# Patient Record
Sex: Male | Born: 1945 | Race: White | Hispanic: No | Marital: Married | Smoking: Former smoker
Health system: Southern US, Community
[De-identification: ages and names within clinical notes are randomized; demographics above are authoritative.]

## PROBLEM LIST (undated history)

## (undated) DIAGNOSIS — I10 Essential (primary) hypertension: Secondary | ICD-10-CM

## (undated) DIAGNOSIS — I2510 Atherosclerotic heart disease of native coronary artery without angina pectoris: Secondary | ICD-10-CM

## (undated) DIAGNOSIS — H269 Unspecified cataract: Secondary | ICD-10-CM

## (undated) DIAGNOSIS — I214 Non-ST elevation (NSTEMI) myocardial infarction: Secondary | ICD-10-CM

## (undated) HISTORY — DX: Unspecified cataract: H26.9

## (undated) HISTORY — PX: JOINT REPLACEMENT: SHX530

## (undated) HISTORY — PX: ROTATOR CUFF REPAIR: SHX139

## (undated) HISTORY — PX: HERNIA REPAIR: SHX51

## (undated) HISTORY — PX: CORONARY STENT INTERVENTION: CATH118234

## (undated) HISTORY — PX: KNEE CARTILAGE SURGERY: SHX688

## (undated) HISTORY — PX: FOOT SURGERY: SHX648

## (undated) HISTORY — PX: APPENDECTOMY: SHX54

---

## 2017-07-21 ENCOUNTER — Emergency Department (HOSPITAL_COMMUNITY): Payer: Medicare Other

## 2017-07-21 ENCOUNTER — Encounter (HOSPITAL_COMMUNITY): Payer: Self-pay

## 2017-07-21 ENCOUNTER — Observation Stay (HOSPITAL_COMMUNITY)
Admission: EM | Admit: 2017-07-21 | Discharge: 2017-07-22 | Disposition: A | Discharge status: Home / Self Care | Payer: Medicare Other | Attending: Family Medicine | Admitting: Family Medicine

## 2017-07-21 DIAGNOSIS — L819 Disorder of pigmentation, unspecified: Secondary | ICD-10-CM | POA: Diagnosis not present

## 2017-07-21 DIAGNOSIS — K219 Gastro-esophageal reflux disease without esophagitis: Secondary | ICD-10-CM | POA: Diagnosis not present

## 2017-07-21 DIAGNOSIS — I252 Old myocardial infarction: Secondary | ICD-10-CM | POA: Diagnosis not present

## 2017-07-21 DIAGNOSIS — Z79899 Other long term (current) drug therapy: Secondary | ICD-10-CM | POA: Insufficient documentation

## 2017-07-21 DIAGNOSIS — N179 Acute kidney failure, unspecified: Secondary | ICD-10-CM | POA: Insufficient documentation

## 2017-07-21 DIAGNOSIS — K30 Functional dyspepsia: Secondary | ICD-10-CM | POA: Diagnosis not present

## 2017-07-21 DIAGNOSIS — I2510 Atherosclerotic heart disease of native coronary artery without angina pectoris: Secondary | ICD-10-CM | POA: Insufficient documentation

## 2017-07-21 DIAGNOSIS — F419 Anxiety disorder, unspecified: Secondary | ICD-10-CM | POA: Diagnosis not present

## 2017-07-21 DIAGNOSIS — Z955 Presence of coronary angioplasty implant and graft: Secondary | ICD-10-CM | POA: Diagnosis not present

## 2017-07-21 DIAGNOSIS — Z7982 Long term (current) use of aspirin: Secondary | ICD-10-CM | POA: Diagnosis not present

## 2017-07-21 DIAGNOSIS — I10 Essential (primary) hypertension: Secondary | ICD-10-CM | POA: Insufficient documentation

## 2017-07-21 DIAGNOSIS — R079 Chest pain, unspecified: Secondary | ICD-10-CM | POA: Diagnosis not present

## 2017-07-21 DIAGNOSIS — R110 Nausea: Secondary | ICD-10-CM | POA: Diagnosis not present

## 2017-07-21 HISTORY — DX: Non-ST elevation (NSTEMI) myocardial infarction: I21.4

## 2017-07-21 HISTORY — DX: Essential (primary) hypertension: I10

## 2017-07-21 HISTORY — DX: Atherosclerotic heart disease of native coronary artery without angina pectoris: I25.10

## 2017-07-21 LAB — BASIC METABOLIC PANEL WITH GFR
Anion gap: 10 (ref 5–15)
BUN: 17 mg/dL (ref 6–20)
CO2: 25 mmol/L (ref 22–32)
Calcium: 9.3 mg/dL (ref 8.9–10.3)
Chloride: 100 mmol/L — ABNORMAL LOW (ref 101–111)
Creatinine, Ser: 1.14 mg/dL (ref 0.61–1.24)
GFR calc Af Amer: >60 mL/min
GFR calc non Af Amer: >60 mL/min
Glucose, Bld: 104 mg/dL — ABNORMAL HIGH (ref 65–99)
Potassium: 4.4 mmol/L (ref 3.5–5.1)
Sodium: 135 mmol/L (ref 135–145)

## 2017-07-21 LAB — CBC
HCT: 45.4 % (ref 39.0–52.0)
Hemoglobin: 15.6 g/dL (ref 13.0–17.0)
MCH: 29.8 pg (ref 26.0–34.0)
MCHC: 34.4 g/dL (ref 30.0–36.0)
MCV: 86.8 fL (ref 78.0–100.0)
Platelets: 226 10*3/uL (ref 150–400)
RBC: 5.23 MIL/uL (ref 4.22–5.81)
RDW: 13.3 % (ref 11.5–15.5)
WBC: 7.5 10*3/uL (ref 4.0–10.5)

## 2017-07-21 LAB — I-STAT TROPONIN, ED
TROPONIN I, POC: 0 ng/mL (ref 0.00–0.08)
Troponin i, poc: 0 ng/mL (ref 0.00–0.08)

## 2017-07-21 LAB — PROTIME-INR
INR: 1.03
Prothrombin Time: 13.4 seconds (ref 11.4–15.2)

## 2017-07-21 LAB — TROPONIN I

## 2017-07-21 MED ORDER — CARVEDILOL 3.125 MG PO TABS
3.1250 mg | ORAL_TABLET | Freq: Two times a day (BID) | ORAL | Status: DC
  Administered 2017-07-22: 3.125 mg via ORAL
  Filled 2017-07-21 (×3): qty 1

## 2017-07-21 MED ORDER — HEPARIN SODIUM (PORCINE) 5000 UNIT/ML IJ SOLN
5000.0000 [IU] | Freq: Three times a day (TID) | INTRAMUSCULAR | Status: DC
  Administered 2017-07-21 – 2017-07-22 (×2): 5000 [IU] via SUBCUTANEOUS
  Filled 2017-07-21 (×2): qty 1

## 2017-07-21 MED ORDER — NITROGLYCERIN 0.4 MG SL SUBL: 0.4000 mg | SUBLINGUAL_TABLET | SUBLINGUAL | Status: DC | PRN

## 2017-07-21 MED ORDER — ASPIRIN 81 MG PO CHEW
81.0000 mg | CHEWABLE_TABLET | Freq: Every day | ORAL | Status: DC
  Administered 2017-07-22: 81 mg via ORAL
  Filled 2017-07-21: qty 1

## 2017-07-21 MED ORDER — GI COCKTAIL ~~LOC~~: 30.0000 mL | Freq: Once | ORAL | Status: DC | PRN

## 2017-07-21 MED ORDER — PRASUGREL HCL 10 MG PO TABS
10.0000 mg | ORAL_TABLET | Freq: Every day | ORAL | Status: DC
  Administered 2017-07-22: 10 mg via ORAL
  Filled 2017-07-21: qty 1

## 2017-07-21 MED ORDER — LISINOPRIL 10 MG PO TABS
10.0000 mg | ORAL_TABLET | Freq: Every day | ORAL | Status: DC
  Administered 2017-07-22: 10 mg via ORAL
  Filled 2017-07-21: qty 1

## 2017-07-21 MED ORDER — ATORVASTATIN CALCIUM 80 MG PO TABS
80.0000 mg | ORAL_TABLET | Freq: Every day | ORAL | Status: DC
  Administered 2017-07-21: 80 mg via ORAL
  Filled 2017-07-21: qty 1

## 2017-07-21 MED ORDER — ASPIRIN 81 MG PO CHEW
324.0000 mg | CHEWABLE_TABLET | Freq: Once | ORAL | Status: DC
  Filled 2017-07-21: qty 4

## 2017-07-21 MED ORDER — ONDANSETRON HCL 4 MG/2ML IJ SOLN: 4.0000 mg | Freq: Four times a day (QID) | INTRAMUSCULAR | Status: DC | PRN

## 2017-07-21 MED ORDER — ACETAMINOPHEN 325 MG PO TABS: 650.0000 mg | ORAL_TABLET | ORAL | Status: DC | PRN

## 2017-07-21 MED ORDER — LISINOPRIL-HYDROCHLOROTHIAZIDE 10-12.5 MG PO TABS: 1.0000 | ORAL_TABLET | Freq: Every day | ORAL | Status: DC

## 2017-07-21 MED ORDER — HYDROCHLOROTHIAZIDE 12.5 MG PO CAPS
12.5000 mg | ORAL_CAPSULE | Freq: Every day | ORAL | Status: DC
  Administered 2017-07-22: 12.5 mg via ORAL
  Filled 2017-07-21: qty 1

## 2017-07-21 NOTE — ED Notes (Signed)
Carvedilol held as BP soft

## 2017-07-21 NOTE — ED Notes (Signed)
Spoke with Alinda Sierras on 3E; requested ETA of when nurse to transport patient to floor - states she will remind Santiago Glad, RN again that patient needs to be transported.

## 2017-07-21 NOTE — Plan of Care (Signed)
  Problem: Activity: Goal: Risk for activity intolerance will decrease Outcome: Progressing   Problem: Safety: Goal: Ability to remain free from injury will improve Outcome: Progressing   

## 2017-07-21 NOTE — ED Notes (Signed)
Call 3E

## 2017-07-21 NOTE — ED Notes (Signed)
Ordered pt a dinner tray 

## 2017-07-21 NOTE — ED Triage Notes (Signed)
Patient developed recurrent cp lat night after having cardiac stent placed 2 weeks ago at [facility]. On arrival alert and oriented, reports that pain is resolved

## 2017-07-21 NOTE — H&P (Addendum)
[facility] Admission History and Physical Service Pager: [phone]  Patient name: Andres [name] Medical record number: [id number] Date of birth: 1945/08/18 Age: 72 y.o. Gender: male  Primary Care Provider: [name], MD Consultants: curbside consult to outpatient cardiologist  Code Status: Full   Chief Complaint: nausea/indigestion   Assessment and Plan: Andres [name] is a 72 y.o. male presenting with nausea/indigestion x1day . PMH is significant for CAD with recent NSTEMI, erectile dysfunction, anxiety, HTN  Nausea/Indigestion Likely cardiac in origin. Patient believes nausea feels very similar to symptoms prior to last MI. Patient states nausea lasting for entire day so less likely food related. Afebrile and no elevation in WBC count so less likely infectious. Per discussion with ED, ED physcian spoke to [name] (patient's cardiologist) to discuss case. [name] recommended trending serial troponins and am EKG. Per [name], patient's EKG is similar to prior EKG's. Per care everywhere records, EKG on admission for NSTEMI showing nonspecific ST depressions and T wave inversion in precordial leads. iStat troponins neg x2. Cannot rule out GERD. Also notes a hx of GERD intermittently in the past, however states more recent episode of nausea immediately prior to stenting  was proceeded by patient feeling "like he was going to die" while walking to stadium earlier that night. Denies anything like that having happened with this episode of nausea. -admit to telemetry, attending Dr. [name]  -GI cocktail prn  -am EKG -trend troponins  -continuous cardiac monitoring  -am CBC, BMP -ASA 324mg  once  -zofran prn  -vital signs per routine  CAD with recent NSTEMI Cardiac cath from 07/07/17 showing inferior wall hypokinesis with mild reduction in LV systolic function, coronary arteries are calcified, coronary artery disease including 95% calcified mid-RCA  stenosis, 60% mid-LAD stenosis, 50% OM1 stenosis and 50% proximal circumflex stenosis. PCI of mid-RCA with placement of an Onyx drug eluting stent was performed. Patient had follow up appointment with [name], cardiology, on 07/21/17 but missed appointment because he came to ED. Echo 07/02/15 (EF 60->70% w/ exercise). Home meds: ASA 81mg , lipitor 80mg  daily, coreg 3.125mg  bid, prinzide 10-12.5 mg daily, effient 10mg  daily  -continue home meds -low threshold for cardiac consult  -trend troponins  -am EKG  HTN: BP 109/77, per patient usually 120s/80s. Patient denies any back pain, or pain radiating to his back. Blood pressure similar on both arms. States he has not had much to drink today.  - Will continue follow blood pressure  - Continue prinizide for now, hold tomorrow if blood pressure is low   AKI Current Cr 1.14. Cr during admission at [facility] 0.96. Unclear etiology, patient did note he hadn't eaten all day so possibly dehydration  -encourage PO intake  -monitor on daily BMP  Anxiety Currently not taking any anti-anxiety medications. Recent PHQ-9 of 0 on 07/14/17.  -continue to monitor  FEN/GI: heart healthy  Prophylaxis: heparin   Disposition: Admit to telemetry, attending Dr. [name]   History of Present Illness:  Andres [name] is a 72 y.o. male presenting with nausea/indigetion x 1day. States nausea began yesterday and lasted all day but worsened after dinner at 8pm. Patient was then called by his daughter in law who was sick and he drove to [place] to help care for [name]. Patient states nausea feels very similar to symptoms leading to MI earlier this month. States he also had some right arm pain, but states he went fishing 2 days ago and thinks it is from fishing. Patient also endorses  headache that is in temporal/occipital region. No sick contacts.   Patient has taken all his am medications but not PM. Patient does not know which medications he takes but pill box is made  weekly for him.   Patient had scheduled appointment with cardiologist, [name], this afternoon but missed it because he was in hospital.   Review Of Systems: Per HPI with the following additions:   Review of Systems  Constitutional: Negative for chills and fever.  Respiratory: Positive for cough. Negative for shortness of breath and wheezing.   Cardiovascular: Negative for chest pain and palpitations.  Gastrointestinal: Positive for heartburn and nausea. Negative for abdominal pain, diarrhea and vomiting.  Genitourinary: Negative for dysuria, frequency, hematuria and urgency.  Musculoskeletal: Negative for back pain.       R sided back pain  Neurological: Positive for headaches. Negative for dizziness.    Patient Active Problem List   Diagnosis Date Noted  . Chest pain 07/21/2017    Past Medical History: Past Medical History:  Diagnosis Date  . Coronary artery disease   . Hypertension     Past Surgical History: Past Surgical History:  Procedure Laterality Date  . CORONARY STENT INTERVENTION      Social History: Social History   Tobacco Use  . Smoking status: Not on file  Substance Use Topics  . Alcohol use: Not on file  . Drug use: Not on file   Additional social history: denies current tobacco use (quit in 1983), denies current alcohol use (quit in 1025), denies illicit drug use. Lives with wife in apartment in [place].   Please also refer to relevant sections of EMR.  Family History: No family history on file. Father - heart disease   Allergies and Medications: No Known Allergies No current facility-administered medications on file prior to encounter.    Current Outpatient Medications on File Prior to Encounter  Medication Sig Dispense Refill  . aspirin 81 MG chewable tablet Chew 81 mg by mouth daily.    Marland Kitchen atorvastatin (LIPITOR) 80 MG tablet Take 80 mg by mouth daily.    . carvedilol (COREG) 3.125 MG tablet Take 3.125 mg by mouth 2 (two) times daily  with a meal.    . lisinopril-hydrochlorothiazide (PRINZIDE,ZESTORETIC) 10-12.5 MG tablet Take 1 tablet by mouth daily.    . nitroGLYCERIN (NITROSTAT) 0.4 MG SL tablet Place 0.4 mg under the tongue every 5 (five) minutes as needed for chest pain.    . prasugrel (EFFIENT) 10 MG TABS tablet Take 10 mg by mouth daily.      Objective: BP 114/69   Pulse 68   Temp 97.6 F (36.4 C) (Oral)   Resp 10   SpO2 94%  Exam: General: awake and alert, laying in bed, NAD Eyes: PERRL ENTM: moist mucous membranes  Cardiovascular: RRR, no MRG,  Respiratory: CTAB, no wheezes, rales, or rhonchi  Gastrointestinal: soft, non tender, non distended, bowel sounds normal  MSK: no edema, non tender, 5/5 muscle strength bilaterally, normal grip strength  Derm: skin intact, no rashes  Neuro: no focal deficits, sensation intact bilaterally Psych: normal affect   Labs and Imaging: CBC BMET  Recent Labs  Lab 07/21/17 0958  WBC 7.5  HGB 15.6  HCT 45.4  PLT 226   Recent Labs  Lab 07/21/17 0958  NA 135  K 4.4  CL 100*  CO2 25  BUN 17  CREATININE 1.14  GLUCOSE 104*  CALCIUM 9.3      Ref. Range 07/21/2017 10:25  07/21/2017 13:36  Troponin i, poc Latest Ref Range: 0.00 - 0.08 ng/mL 0.00 0.00    Ref. Range 07/21/2017 09:58  Prothrombin Time Latest Ref Range: 11.4 - 15.2 seconds 13.4  INR Unknown 1.03   EKG: nonspecific T wave changes in inferior leads and leads V5-V6  Dg Chest 2 View  Result Date: 07/21/2017 CLINICAL DATA:  Chest pain EXAM: CHEST - 2 VIEW COMPARISON:  None. FINDINGS: There is no edema or consolidation. The heart size and pulmonary vascularity are normal. No adenopathy. There is thoracolumbar levoscoliosis. There is calcification in the right coronary artery. IMPRESSION: No edema or consolidation. Right coronary artery calcification noted. Electronically Signed   By: Lowella Grip III M.D.   On: 07/21/2017 10:18     Caroline More, DO 07/21/2017, 4:38 PM PGY-1, [place] [facility] pager: [phone], text pages welcome  UPPER LEVEL ADDENDUM  I have read the above note and made revisions highlighted in blue.  Kerrin Mo, MD, PGY-3 [facility]

## 2017-07-21 NOTE — ED Notes (Signed)
Report given to Bynum on 3E.  They will come get pt

## 2017-07-21 NOTE — Progress Notes (Signed)
Pt. Transferred from ED holding in alert and stable condition. No s/s of distress or discomfort noted upon arrival. Pt. Denies pain at this time. Pt. Oriented to room and placed on telemetry. CCMD notified of pts. Arrival to unit. Call light placed within reach. Pt. Alert and independent in the room. Pt. Told to call for assisted when needed. RN will continue to monitor pt. For changes in condition.Andres [name], [name]

## 2017-07-21 NOTE — ED Provider Notes (Signed)
[facility] EMERGENCY DEPARTMENT Provider Note   CSN: [id number] Arrival date & time: 07/21/17  5465     History   Chief Complaint No chief complaint on file.   HPI Andres [name] is a 72 y.o. male.  72 year old male with past medical history including hypertension, NSTEMI, CAD status post stenting 2 weeks ago who p/w nausea and digestion.  Patient states that he presented to his PCP 2 weeks ago for what he thought was indigestion.  They drew labs and noted an elevated troponin and he was sent to [facility] where he had a cardiac catheterization and stent placement.  He was discharged home the next day on medications which he has been taking as prescribed.  He reports that he has been doing well at home until yesterday evening.  He states that he ate a heavy meal and afterwards began feeling nauseated and indigestion feeling. He had some right arm pain but thinks it was related to fishing with a lot of casting from right arm yesterday. He thought the nausea/indigestion was related to what he ate and the fact that he has been not drinking caffeine recently but he later became concerned that it might be related to his heart as he had the same symptoms leading up to his heart attack.  He denies any chest pain, tightness, or discomfort whatsoever and no shortness of breath, vomiting, or diaphoresis.  He denies any exertional shortness of breath or chest pain recently.  He was supposed to have a first-time cardiology appointment this afternoon but came here as he is currently visiting his daughter-in-law who is ill.  He still has the nausea indigestion feeling but again denies any chest pain.  The history is provided by the patient.    Past Medical History:  Diagnosis Date  . Coronary artery disease   . Hypertension     Patient Active Problem List   Diagnosis Date Noted  . Chest pain 07/21/2017    Past Surgical History:  Procedure Laterality Date  . CORONARY STENT  INTERVENTION          Home Medications    Prior to Admission medications   Medication Sig Start Date End Date Taking? Authorizing Provider  aspirin 81 MG chewable tablet Chew 81 mg by mouth daily.   Yes [provider]  atorvastatin (LIPITOR) 80 MG tablet Take 80 mg by mouth daily.   Yes [provider]  carvedilol (COREG) 3.125 MG tablet Take 3.125 mg by mouth 2 (two) times daily with a meal.   Yes [provider]  lisinopril-hydrochlorothiazide (PRINZIDE,ZESTORETIC) 10-12.5 MG tablet Take 1 tablet by mouth daily.   Yes [provider]  nitroGLYCERIN (NITROSTAT) 0.4 MG SL tablet Place 0.4 mg under the tongue every 5 (five) minutes as needed for chest pain.   Yes [provider]  prasugrel (EFFIENT) 10 MG TABS tablet Take 10 mg by mouth daily.   Yes [provider]    Family History No family history on file.  Social History Social History   Tobacco Use  . Smoking status: Not on file  Substance Use Topics  . Alcohol use: Not on file  . Drug use: Not on file     Allergies   Patient has no known allergies.   Review of Systems Review of Systems  All other systems reviewed and are negative except that which was mentioned in HPI  Physical Exam Updated Vital Signs BP 112/86 (BP Location: Right Arm)   Pulse Marland Kitchen)  58   Temp 97.6 F (36.4 C) (Oral)   Resp 14   SpO2 97%   Physical Exam  Constitutional: He is oriented to person, place, and time. He appears well-developed and well-nourished. No distress.  HENT:  Head: Normocephalic and atraumatic.  Moist mucous membranes  Eyes: Conjunctivae are normal.  Neck: Neck supple.  Cardiovascular: Normal rate, regular rhythm and normal heart sounds.  No murmur heard. Pulmonary/Chest: Effort normal and breath sounds normal.  Abdominal: Soft. Bowel sounds are normal. He exhibits no distension. There is no tenderness.  Musculoskeletal: He exhibits no edema.  Neurological: He is  alert and oriented to person, place, and time.  Fluent speech  Skin: Skin is warm and dry.  Psychiatric:  anxious  Nursing note and vitals reviewed.    ED Treatments / Results  Labs (all labs ordered are listed, but only abnormal results are displayed) Labs Reviewed  BASIC METABOLIC PANEL - Abnormal; Notable for the following components:      Result Value   Chloride 100 (*)    Glucose, Bld 104 (*)    All other components within normal limits  CBC  PROTIME-INR  I-STAT TROPONIN, ED  I-STAT TROPONIN, ED    EKG EKG Interpretation  Date/Time:  Wednesday July 21 2017 09:51:08 EDT Ventricular Rate:  66 PR Interval:  138 QRS Duration: 84 QT Interval:  410 QTC Calculation: 429 R Axis:   6 Text Interpretation:  Normal sinus rhythm ST & T wave abnormality, consider inferior ischemia ST & T wave abnormality, consider anterolateral ischemia Abnormal ECG No previous ECGs available Confirmed by Theotis Burrow [phone]) on 07/21/2017 12:07:18 PM   Radiology Dg Chest 2 View  Result Date: 07/21/2017 CLINICAL DATA:  Chest pain EXAM: CHEST - 2 VIEW COMPARISON:  None. FINDINGS: There is no edema or consolidation. The heart size and pulmonary vascularity are normal. No adenopathy. There is thoracolumbar levoscoliosis. There is calcification in the right coronary artery. IMPRESSION: No edema or consolidation. Right coronary artery calcification noted. Electronically Signed   By: Lowella Grip III M.D.   On: 07/21/2017 10:18    Procedures Procedures (including critical care time)  Medications Ordered in ED Medications  aspirin chewable tablet 324 mg (324 mg Oral Not Given 07/21/17 1438)     Initial Impression / Assessment and Plan / ED Course  I have reviewed the triage vital signs and the nursing notes.  Pertinent labs & imaging results that were available during my care of the patient were reviewed by me and considered in my medical decision making (see chart for details).      Pt well appearing, anxious on exam, mildly hypertensive.  EKG without acute ischemic changes, no previous available for comparison.  Initial troponin negative and reassuring basic labs. No sx to suggest PE or aortic dissection.  CXR negative.  I discussed pt's presentation and work up with his cardiologist at [facility], Dr. Royce Macadamia.  She felt that the EKG was relatively similar to previous.  He was noted to have some stenosis of his LAD on the cath from last week.  Because of this and his current symptoms that are similar to symptoms that previously brought him into the hospital, she recommended observation admission for serial troponins and EKGs.  She felt that if workup negative, he can be discharged to follow-up in her clinic.  If the patient's troponin climbs, the [facility] cardiology team can be contacted to discuss possible transfer. Discussed admission with family medicine teaching service and patient  admitted for further care.  Final Clinical Impressions(s) / ED Diagnoses   Final diagnoses:  None    ED Discharge Orders    None       Little, Wenda Overland, MD 07/21/17 [phone]

## 2017-07-21 NOTE — ED Notes (Signed)
Called 3E to remind them that patient needs to be transported to floor; Network engineer states will remind nurse.

## 2017-07-22 ENCOUNTER — Other Ambulatory Visit: Payer: Self-pay

## 2017-07-22 ENCOUNTER — Encounter (HOSPITAL_COMMUNITY): Payer: Self-pay | Admitting: General Practice

## 2017-07-22 DIAGNOSIS — R079 Chest pain, unspecified: Secondary | ICD-10-CM | POA: Diagnosis not present

## 2017-07-22 DIAGNOSIS — K30 Functional dyspepsia: Secondary | ICD-10-CM | POA: Diagnosis not present

## 2017-07-22 DIAGNOSIS — R110 Nausea: Secondary | ICD-10-CM

## 2017-07-22 DIAGNOSIS — I2510 Atherosclerotic heart disease of native coronary artery without angina pectoris: Secondary | ICD-10-CM | POA: Diagnosis not present

## 2017-07-22 LAB — BASIC METABOLIC PANEL
Anion gap: 9 (ref 5–15)
BUN: 20 mg/dL (ref 6–20)
CO2: 26 mmol/L (ref 22–32)
CREATININE: 1.24 mg/dL (ref 0.61–1.24)
Calcium: 8.9 mg/dL (ref 8.9–10.3)
Chloride: 102 mmol/L (ref 101–111)
GFR calc non Af Amer: 58 mL/min — ABNORMAL LOW (ref >60)
GLUCOSE: 93 mg/dL (ref 65–99)
Potassium: 4 mmol/L (ref 3.5–5.1)
Sodium: 137 mmol/L (ref 135–145)

## 2017-07-22 LAB — TROPONIN I: Troponin I: <0.03 ng/mL (ref <0.03)

## 2017-07-22 LAB — CBC
HCT: 42.8 % (ref 39.0–52.0)
Hemoglobin: 14.7 g/dL (ref 13.0–17.0)
MCH: 29.8 pg (ref 26.0–34.0)
MCHC: 34.3 g/dL (ref 30.0–36.0)
MCV: 86.6 fL (ref 78.0–100.0)
PLATELETS: 193 10*3/uL (ref 150–400)
RBC: 4.94 MIL/uL (ref 4.22–5.81)
RDW: 13.3 % (ref 11.5–15.5)
WBC: 6.7 10*3/uL (ref 4.0–10.5)

## 2017-07-22 LAB — GLUCOSE, CAPILLARY: GLUCOSE-CAPILLARY: 92 mg/dL (ref 65–99)

## 2017-07-22 MED ORDER — LISINOPRIL 5 MG PO TABS: 10.0000 mg | ORAL_TABLET | Freq: Every day | ORAL | 0 refills | Status: DC

## 2017-07-22 NOTE — Discharge Instructions (Signed)
You were admitted to the hospital with nausea and had a rule out for acute cardiac syndrome.  Lab work and EKG showed no cardiac etiology.  It is very important that you follow-up with your outpatient cardiologist as soon as possible.  After discharge please stop taking your lisinopril/hydrochlorothiazide.  I have sent lisinopril 5 mg to take daily before going to your cardiologist.  Continue to work on watching the sodium in your diet.

## 2017-07-22 NOTE — Discharge Summary (Addendum)
[facility] Discharge Summary  Patient name: Andres [name] Medical record number: [id number] Date of birth: 1945-09-27 Age: 72 y.o. Gender: male Date of Admission: 07/21/2017  Date of Discharge: 07/22/17  Admitting Physician: Lind Covert, MD  Primary Care Provider: [name], MD Consultants: None  Indication for Hospitalization: Nausea and ACS rule out  Discharge Diagnoses/Problem List:  Nausea/indigestion CAD with recent N STEMI Hypertension AKI Anxiety  Disposition: Home  Discharge Condition: Stable  Discharge Exam:  General: NAD, pleasant, sitting up in bed Eyes: PERRL, EOMI, no conjunctival pallor or injection ENTM: Moist mucous membranes Cardiovascular: RRR, no m/r/g, no LE edema Respiratory: CTA BL, normal work of breathing Derm: no rashes appreciated Neuro: CN II-XII grossly intact Psych: AOx3, appropriate affect  Brief Hospital Course:  This 72 year old male presented to the hospital with nausea and indigestion for 1 day.  Patient with recent heart cath on 07/07/2017 with stent placement.  During previous episode patient reported feeling nauseous and wanted to rule out cardiac event at this time.  In the ED, ED provider reported that she spoke with outpatient cardiologist [name] whom patient was supposed to follow-up with on day of admission.  [name] desired trending of troponins as well as a.m. EKG to ensure there are no cardiac etiology.  Patient also with history of GERD.  Prior to discharge patient with negative troponin x3 and unchanged EKG from prior with TWI in same leads as seen in Care Everywhere on 3/19.    Patient to follow-up with cardiologist Monday, April 1.  Prior to discharge patient denied nausea and reported no chest pain.  Patient reported that he is feeling much better.  Issues for Follow Up:  1. Patient with mild AK I on admission, HCTZ held at discharge to be restarted by cardiologist/PCP. Also  encouraged to have good intake.  2. Patient also with soft BPs.  Lisinopril 10 decrease to lisinopril 5 mg prior to discharge.  May need to increase back to previous dose. 3. Patient has a large 1 cm irregular pigmented lesion on upper mid back that might need a biopsy to rule out skin cancer  Significant Procedures: None  Significant Labs and Imaging:  Recent Labs  Lab 07/21/17 0958 07/22/17 0400  WBC 7.5 6.7  HGB 15.6 14.7  HCT 45.4 42.8  PLT 226 193   Recent Labs  Lab 07/21/17 0958 07/22/17 0400  NA 135 137  K 4.4 4.0  CL 100* 102  CO2 25 26  GLUCOSE 104* 93  BUN 17 20  CREATININE 1.14 1.24  CALCIUM 9.3 8.9   Troponin negative x3  Results/Tests Pending at Time of Discharge: None  Discharge Medications:  Allergies as of 07/22/2017   No Known Allergies     Medication List    STOP taking these medications   lisinopril-hydrochlorothiazide 10-12.5 MG tablet Commonly known as:  PRINZIDE,ZESTORETIC     TAKE these medications   aspirin 81 MG chewable tablet Chew 81 mg by mouth daily.   atorvastatin 80 MG tablet Commonly known as:  LIPITOR Take 80 mg by mouth daily.   carvedilol 3.125 MG tablet Commonly known as:  COREG Take 3.125 mg by mouth 2 (two) times daily with a meal.   lisinopril 5 MG tablet Commonly known as:  PRINIVIL,ZESTRIL Take 2 tablets (10 mg total) by mouth daily.   nitroGLYCERIN 0.4 MG SL tablet Commonly known as:  NITROSTAT Place 0.4 mg under the tongue every 5 (five) minutes as needed  for chest pain.   prasugrel 10 MG Tabs tablet Commonly known as:  EFFIENT Take 10 mg by mouth daily.       Discharge Instructions: Please refer to Patient Instructions section of EMR for full details.  Patient was counseled important signs and symptoms that should prompt return to medical care, changes in medications, dietary instructions, activity restrictions, and follow up appointments.   Follow-Up Appointments:   [name], [name], DO 07/22/2017,  8:20 AM PGY-1, [place]

## 2017-07-22 NOTE — Progress Notes (Signed)
Pt discharged home, IV and tele removed. Discharged instructions given.

## 2020-09-23 ENCOUNTER — Emergency Department (HOSPITAL_COMMUNITY)
Admission: EM | Admit: 2020-09-23 | Discharge: 2020-09-23 | Disposition: A | Discharge status: Home / Self Care | Payer: Medicare Other | Attending: Emergency Medicine | Admitting: Emergency Medicine

## 2020-09-23 ENCOUNTER — Other Ambulatory Visit: Payer: Self-pay

## 2020-09-23 ENCOUNTER — Emergency Department (HOSPITAL_COMMUNITY): Payer: Medicare Other

## 2020-09-23 ENCOUNTER — Encounter (HOSPITAL_COMMUNITY): Payer: Self-pay | Admitting: *Deleted

## 2020-09-23 DIAGNOSIS — I2510 Atherosclerotic heart disease of native coronary artery without angina pectoris: Secondary | ICD-10-CM | POA: Insufficient documentation

## 2020-09-23 DIAGNOSIS — Z87891 Personal history of nicotine dependence: Secondary | ICD-10-CM | POA: Insufficient documentation

## 2020-09-23 DIAGNOSIS — W500XXA Accidental hit or strike by another person, initial encounter: Secondary | ICD-10-CM | POA: Diagnosis not present

## 2020-09-23 DIAGNOSIS — I10 Essential (primary) hypertension: Secondary | ICD-10-CM | POA: Diagnosis not present

## 2020-09-23 DIAGNOSIS — Z7982 Long term (current) use of aspirin: Secondary | ICD-10-CM | POA: Diagnosis not present

## 2020-09-23 DIAGNOSIS — Y9367 Activity, basketball: Secondary | ICD-10-CM | POA: Insufficient documentation

## 2020-09-23 DIAGNOSIS — S20211A Contusion of right front wall of thorax, initial encounter: Secondary | ICD-10-CM | POA: Diagnosis not present

## 2020-09-23 DIAGNOSIS — Z955 Presence of coronary angioplasty implant and graft: Secondary | ICD-10-CM | POA: Diagnosis not present

## 2020-09-23 DIAGNOSIS — Z79899 Other long term (current) drug therapy: Secondary | ICD-10-CM | POA: Insufficient documentation

## 2020-09-23 DIAGNOSIS — S299XXA Unspecified injury of thorax, initial encounter: Secondary | ICD-10-CM | POA: Diagnosis present

## 2020-09-23 NOTE — ED Provider Notes (Signed)
[facility] DEPT Provider Note   CSN: [id number] Arrival date & time: 09/23/20  8315     History Chief Complaint  Patient presents with  . Rib Injury    Andres [name] is a 75 y.o. male.  HPI Patient presents with right-sided chest pain after injury a week ago.  States he was playing basketball and collided sideways with another player.  Pain in the right chest wall immediately.  Has had some continued pain but not too severe.  States began to hurt more today.  Not really short of breath.  Worse with certain movements.  No abdominal pain.  No lightheadedness or dizziness.  No bruising.    Past Medical History:  Diagnosis Date  . Coronary artery disease   . Hypertension   . NSTEMI (non-ST elevated myocardial infarction) ([place]) 3/209    Patient Active Problem List   Diagnosis Date Noted  . Nausea   . Chest pain 07/21/2017    Past Surgical History:  Procedure Laterality Date  . APPENDECTOMY    . CORONARY STENT INTERVENTION    . FOOT SURGERY    . HERNIA REPAIR    . KNEE CARTILAGE SURGERY Right   . ROTATOR CUFF REPAIR         No family history on file.  Social History   Tobacco Use  . Smoking status: Former Research scientist (life sciences)  . Smokeless tobacco: Never Used  Vaping Use  . Vaping Use: Never used  Substance Use Topics  . Alcohol use: Not Currently  . Drug use: Never    Home Medications Prior to Admission medications   Medication Sig Start Date End Date Taking? Authorizing Provider  aspirin 81 MG chewable tablet Chew 81 mg by mouth daily.    [provider]  atorvastatin (LIPITOR) 80 MG tablet Take 80 mg by mouth daily.    [provider]  carvedilol (COREG) 3.125 MG tablet Take 3.125 mg by mouth 2 (two) times daily with a meal.    [provider]  lisinopril (PRINIVIL,ZESTRIL) 5 MG tablet Take 2 tablets (10 mg total) by mouth daily. 07/22/17   [name], [name], DO  nitroGLYCERIN (NITROSTAT) 0.4 MG SL tablet Place  0.4 mg under the tongue every 5 (five) minutes as needed for chest pain.    [provider]  prasugrel (EFFIENT) 10 MG TABS tablet Take 10 mg by mouth daily.    [provider]    Allergies    Patient has no known allergies.  Review of Systems   Review of Systems  Constitutional: Negative for activity change and fever.  HENT: Negative for congestion.   Respiratory: Negative for shortness of breath.   Cardiovascular: Positive for chest pain. Negative for leg swelling.  Gastrointestinal: Negative for abdominal pain.  Genitourinary: Negative for flank pain.  Musculoskeletal: Negative for back pain.  Skin: Negative for rash.  Neurological: Negative for weakness.  Psychiatric/Behavioral: Negative for confusion.    Physical Exam Updated Vital Signs BP (!) 147/94   Pulse 63   Temp 98.7 F (37.1 C) (Oral)   Resp 18   Ht 5\' 9"  (1.753 m)   Wt 83.9 kg   BMI 27.32 kg/m   Physical Exam Vitals and nursing note reviewed.  HENT:     Head: Atraumatic.     Right Ear: External ear normal.     Left Ear: External ear normal.     Nose: Nose normal.  Cardiovascular:     Rate and Rhythm: Regular rhythm.  Pulmonary:     Breath sounds: No rhonchi.     Comments: Tenderness to right lateral to anterior lower chest wall.  Is point tender without crepitance or deformity. Chest:     Chest wall: Tenderness present.  Abdominal:     Tenderness: There is no abdominal tenderness.     Comments: No abdominal tenderness.  Specifically no right upper quadrant or subcostal tenderness.  No bruising.  No rash.  Musculoskeletal:        General: Normal range of motion.     Cervical back: Neck supple.  Skin:    General: Skin is warm.     Capillary Refill: Capillary refill takes less than 2 seconds.  Neurological:     Mental Status: He is alert and oriented to person, place, and time.     ED Results / Procedures / Treatments   Labs (all labs ordered are listed, but only abnormal  results are displayed) Labs Reviewed - No data to display  EKG None  Radiology DG Ribs Unilateral W/Chest Right  Result Date: 09/23/2020 CLINICAL DATA:  Pain EXAM: RIGHT RIBS AND CHEST - 3+ VIEW COMPARISON:  March 27th 2019 FINDINGS: Unchanged cardiomediastinal silhouette. Atherosclerotic calcifications. Tortuous thoracic aorta. No pleural effusion or pneumothorax. There are well-healed contour deformities of the RIGHT upper second and third ribs laterally consistent with sequela of remote prior fracture. There is subtle concavity of the RIGHT lateral ninth rib consistent with age-indeterminate fracture. BB overlies the RIGHT lateral tenth rib. Age-indeterminate wedging of L1 and L2. Dextrocurvature of the thoracic spine with levocurvature of the lumbar spine. IMPRESSION: 1. Subtle concavity of the RIGHT lateral ninth rib is consistent with age-indeterminate fracture. Recommend correlation with point tenderness. 2. No displaced fracture subjacent to the BB which marks the site of palpable/painful concern. 3. No pneumothorax. 4. Age-indeterminate wedging of L1 and 2, favored chronic. Recommend correlation with point tenderness. Electronically Signed   By: Valentino Saxon MD   On: 09/23/2020 11:36    Procedures Procedures   Medications Ordered in ED Medications - No data to display  ED Course  I have reviewed the triage vital signs and the nursing notes.  Pertinent labs & imaging results that were available during my care of the patient were reviewed by me and considered in my medical decision making (see chart for details).    MDM Rules/Calculators/A&P                          Patient with right-sided chest pain.  Traumatic.  X-ray shows only old fracture of ribs.  Nothing new seen.  No pneumothorax.  No abdominal tenderness.  Doubt liver injury.  Chest wall contusion versus occult fracture.  Does not think he needs pain meds.  Discharge home with outpatient follow-up as needed Final  Clinical Impression(s) / ED Diagnoses Final diagnoses:  Contusion of right chest wall, initial encounter    Rx / DC Orders ED Discharge Orders    None       Davonna Belling, MD 09/23/20 [phone]

## 2020-09-23 NOTE — ED Triage Notes (Signed)
Pt states he was playing basketball a week ago, body [place] with pain in rt lower rib. Pain continues.

## 2020-11-04 ENCOUNTER — Other Ambulatory Visit: Payer: Self-pay | Admitting: Family Medicine

## 2020-11-04 ENCOUNTER — Ambulatory Visit
Admission: RE | Admit: 2020-11-04 | Discharge: 2020-11-04 | Disposition: A | Discharge status: Home / Self Care | Payer: Medicare Other | Source: Ambulatory Visit | Attending: Family Medicine | Admitting: Family Medicine

## 2020-11-04 DIAGNOSIS — R911 Solitary pulmonary nodule: Secondary | ICD-10-CM

## 2021-04-17 ENCOUNTER — Other Ambulatory Visit: Payer: Self-pay

## 2021-04-17 ENCOUNTER — Encounter (HOSPITAL_BASED_OUTPATIENT_CLINIC_OR_DEPARTMENT_OTHER): Payer: Self-pay

## 2021-04-17 ENCOUNTER — Emergency Department (HOSPITAL_BASED_OUTPATIENT_CLINIC_OR_DEPARTMENT_OTHER)
Admission: EM | Admit: 2021-04-17 | Discharge: 2021-04-17 | Disposition: A | Discharge status: Home / Self Care | Payer: Medicare Other | Attending: Emergency Medicine | Admitting: Emergency Medicine

## 2021-04-17 DIAGNOSIS — Z87891 Personal history of nicotine dependence: Secondary | ICD-10-CM | POA: Insufficient documentation

## 2021-04-17 DIAGNOSIS — T7840XA Allergy, unspecified, initial encounter: Secondary | ICD-10-CM

## 2021-04-17 DIAGNOSIS — I2510 Atherosclerotic heart disease of native coronary artery without angina pectoris: Secondary | ICD-10-CM | POA: Insufficient documentation

## 2021-04-17 DIAGNOSIS — Z79899 Other long term (current) drug therapy: Secondary | ICD-10-CM | POA: Insufficient documentation

## 2021-04-17 DIAGNOSIS — Z20822 Contact with and (suspected) exposure to covid-19: Secondary | ICD-10-CM | POA: Insufficient documentation

## 2021-04-17 DIAGNOSIS — I119 Hypertensive heart disease without heart failure: Secondary | ICD-10-CM | POA: Diagnosis not present

## 2021-04-17 DIAGNOSIS — R220 Localized swelling, mass and lump, head: Secondary | ICD-10-CM | POA: Diagnosis present

## 2021-04-17 DIAGNOSIS — Z7982 Long term (current) use of aspirin: Secondary | ICD-10-CM | POA: Diagnosis not present

## 2021-04-17 LAB — CBC WITH DIFFERENTIAL/PLATELET
Abs Immature Granulocytes: 0.03 10*3/uL (ref 0.00–0.07)
Basophils Absolute: 0 10*3/uL (ref 0.0–0.1)
Basophils Relative: 0 %
Eosinophils Absolute: 0.2 10*3/uL (ref 0.0–0.5)
Eosinophils Relative: 2 %
HCT: 47.2 % (ref 39.0–52.0)
Hemoglobin: 15.6 g/dL (ref 13.0–17.0)
Immature Granulocytes: 0 %
Lymphocytes Relative: 14 %
Lymphs Abs: 1.5 10*3/uL (ref 0.7–4.0)
MCH: 29.6 pg (ref 26.0–34.0)
MCHC: 33.1 g/dL (ref 30.0–36.0)
MCV: 89.6 fL (ref 80.0–100.0)
Monocytes Absolute: 0.7 10*3/uL (ref 0.1–1.0)
Monocytes Relative: 6 %
Neutro Abs: 8.3 10*3/uL — ABNORMAL HIGH (ref 1.7–7.7)
Neutrophils Relative %: 78 %
Platelets: 268 10*3/uL (ref 150–400)
RBC: 5.27 MIL/uL (ref 4.22–5.81)
RDW: 14 % (ref 11.5–15.5)
WBC: 10.7 10*3/uL — ABNORMAL HIGH (ref 4.0–10.5)
nRBC: 0 % (ref 0.0–0.2)

## 2021-04-17 LAB — BASIC METABOLIC PANEL
Anion gap: 9 (ref 5–15)
BUN: 19 mg/dL (ref 8–23)
CO2: 29 mmol/L (ref 22–32)
Calcium: 9.3 mg/dL (ref 8.9–10.3)
Chloride: 102 mmol/L (ref 98–111)
Creatinine, Ser: 1.06 mg/dL (ref 0.61–1.24)
GFR, Estimated: >60 mL/min (ref >60)
Glucose, Bld: 109 mg/dL — ABNORMAL HIGH (ref 70–99)
Potassium: 3.9 mmol/L (ref 3.5–5.1)
Sodium: 140 mmol/L (ref 135–145)

## 2021-04-17 LAB — RESP PANEL BY RT-PCR (FLU A&B, COVID) ARPGX2
Influenza A by PCR: NEGATIVE
Influenza B by PCR: NEGATIVE
SARS Coronavirus 2 by RT PCR: NEGATIVE

## 2021-04-17 MED ORDER — EPINEPHRINE 0.3 MG/0.3ML IJ SOAJ: 0.3000 mg | INTRAMUSCULAR | 1 refills | Status: AC | PRN

## 2021-04-17 MED ORDER — DIPHENHYDRAMINE HCL 25 MG PO CAPS
50.0000 mg | ORAL_CAPSULE | Freq: Once | ORAL | Status: AC
  Administered 2021-04-17: 09:00:00 50 mg via ORAL
  Filled 2021-04-17: qty 2

## 2021-04-17 NOTE — Discharge Instructions (Addendum)
If you develop new or worsening throat symptoms including trouble breathing or swallowing, new or worsening facial/lip/tongue swelling, or any other new/concerning symptoms then return to the ER for evaluation.  If you have to use your EpiPen, call 911.  Take benadryl every 4-6 hours for rash/itching/swelling

## 2021-04-17 NOTE — ED Triage Notes (Signed)
Pt arrives POV with complaints of allergic reaction. Pt presents with hives on bil arms, abdomen, swelling on mouth and lips, rash on his groin area that started yesterday. Pt says he ate chocolate cake and ice cream yesterday. Benadryl and tylenol last taken last night. Pt does take lisinopril and last took it last night. Pt denies trouble breathing but says he has difficulty speaking. AOx4, VSS

## 2021-04-17 NOTE — ED Provider Notes (Signed)
[facility] EMERGENCY DEPT Provider Note   CSN: [id number] Arrival date & time: 04/17/21  6160     History Chief Complaint  Patient presents with   Allergic Reaction    Andres [name] is a 75 y.o. male.  HPI 75 year old male presents with lip swelling and change in voice.  This started yesterday.  At first it was just his top lips but now it is both lips.  He is also had swelling and itchiness to his arms and groin.  He is eating many different things that he wonders if they could be causing it though he has had these foods many times before.  Took some Benadryl last night.  After waking up he drank some coffee and then took a nap and when he woke up he was having a change in his voice feels like it is in his larynx.  He is not having trouble breathing and is able to swallow. 40+ years ago he used to have swelling of his lips that he states hasn't recurred since he stopped using drugs.   Past Medical History:  Diagnosis Date   Coronary artery disease    Hypertension    NSTEMI (non-ST elevated myocardial infarction) ([place]) 3/209    Patient Active Problem List   Diagnosis Date Noted   Nausea    Chest pain 07/21/2017    Past Surgical History:  Procedure Laterality Date   APPENDECTOMY     CORONARY STENT INTERVENTION     FOOT SURGERY     HERNIA REPAIR     KNEE CARTILAGE SURGERY Right    ROTATOR CUFF REPAIR         No family history on file.  Social History   Tobacco Use   Smoking status: Former   Smokeless tobacco: Never  Scientific laboratory technician Use: Never used  Substance Use Topics   Alcohol use: Not Currently   Drug use: Never    Home Medications Prior to Admission medications   Medication Sig Start Date End Date Taking? Authorizing Provider  EPINEPHrine 0.3 mg/0.3 mL IJ SOAJ injection Inject 0.3 mg into the muscle as needed for anaphylaxis. 04/17/21  Yes Sherwood Gambler, MD  aspirin 81 MG chewable tablet Chew 81 mg by mouth daily.    [provider]  atorvastatin (LIPITOR) 80 MG tablet Take 80 mg by mouth daily.    [provider]  carvedilol (COREG) 3.125 MG tablet Take 3.125 mg by mouth 2 (two) times daily with a meal.    [provider]  lisinopril (PRINIVIL,ZESTRIL) 5 MG tablet Take 2 tablets (10 mg total) by mouth daily. 07/22/17   [name], [name], DO  nitroGLYCERIN (NITROSTAT) 0.4 MG SL tablet Place 0.4 mg under the tongue every 5 (five) minutes as needed for chest pain.    [provider]  prasugrel (EFFIENT) 10 MG TABS tablet Take 10 mg by mouth daily.    [provider]    Allergies    Patient has no known allergies.  Review of Systems   Review of Systems  Constitutional:  Negative for fever.  HENT:  Positive for facial swelling and voice change. Negative for trouble swallowing.   Respiratory:  Negative for shortness of breath.   Gastrointestinal:  Negative for vomiting.  Skin:  Positive for rash.  All other systems reviewed and are negative.  Physical Exam Updated Vital Signs BP 122/88 (BP Location: Right Arm)    Pulse 61    Temp 98.4 F (36.9  C) (Oral)    Resp 14    Ht 5\' 9"  (1.753 m)    Wt 78.5 kg    SpO2 95%    BMI 25.55 kg/m   Physical Exam Vitals and nursing note reviewed.  Constitutional:      Appearance: He is well-developed.  HENT:     Head: Normocephalic and atraumatic.     Right Ear: External ear normal.     Left Ear: External ear normal.     Nose: Nose normal.     Mouth/Throat:     Comments: Mild to moderate upper and lower lip swelling.  No tongue or obvious oropharyngeal swelling.  Patient is speaking clearly though reports his voice is a little deeper than normal.  There is no stridor. Eyes:     General:        Right eye: No discharge.        Left eye: No discharge.  Cardiovascular:     Rate and Rhythm: Normal rate and regular rhythm.     Heart sounds: Normal heart sounds.  Pulmonary:     Effort: Pulmonary effort is normal.     Breath sounds:  Normal breath sounds. No stridor. No wheezing or rales.  Abdominal:     General: There is no distension.     Palpations: Abdomen is soft.     Tenderness: There is no abdominal tenderness.  Musculoskeletal:     Cervical back: Neck supple.  Skin:    General: Skin is warm and dry.     Findings: Rash present.     Comments: Hives, most prominent in arms, groin and trunk  Neurological:     Mental Status: He is alert.  Psychiatric:        Mood and Affect: Mood is not anxious.    ED Results / Procedures / Treatments   Labs (all labs ordered are listed, but only abnormal results are displayed) Labs Reviewed  BASIC METABOLIC PANEL - Abnormal; Notable for the following components:      Result Value   Glucose, Bld 109 (*)    All other components within normal limits  CBC WITH DIFFERENTIAL/PLATELET - Abnormal; Notable for the following components:   WBC 10.7 (*)    Neutro Abs 8.3 (*)    All other components within normal limits  RESP PANEL BY RT-PCR (FLU A&B, COVID) [id number]    EKG EKG Interpretation  Date/Time:  Thursday April 17 2021 08:44:35 EST Ventricular Rate:  84 PR Interval:  128 QRS Duration: 89 QT Interval:  386 QTC Calculation: 457 R Axis:   64 Text Interpretation: Sinus rhythm RSR' in V1 or V2, probably normal variant Confirmed by Sherwood Gambler [phone]) on 04/17/2021 9:08:50 AM  Radiology No results found.  Procedures Procedures   Medications Ordered in ED Medications  diphenhydrAMINE (BENADRYL) capsule 50 mg (50 mg Oral Given 04/17/21 5277)    ED Course  I have reviewed the triage vital signs and the nursing notes.  Pertinent labs & imaging results that were available during my care of the patient were reviewed by me and considered in my medical decision making (see chart for details).  Clinical Course as of 04/17/21 1522  Thu Apr 17, 2021  8242 Patient is refusing steroids, and doesn't want benadryl. We discussed using epi-pen, but he wants me to call  his cardiologist first. He is not in distress, but we had a long discussion about treatment for allergic reactions and the +/- of epinephrine. [SG]  1056 Patient  remains well-appearing.  He has not gotten any worse and reports maybe some slight improvement in his throat symptoms.  He is still refusing steroids.  He asked me to call his PCP, Dr. Hulen Skains, but when I called she is not in the office today.  He is noting a bit more of a sore throat and scratchy throat in addition to the voice change and so while he does appear to have some sort of allergic component I will also get a COVID/flu test.  He wants to be observed a little longer in the emergency department. [SG]    Clinical Course User Index [SG] Sherwood Gambler, MD   MDM Rules/Calculators/A&P                         Patient seems to have an allergic reaction with the hives in addition to lip swelling.  I have offered epi multiple times but he declines and wants me to call his doctor.  Unfortunately could not get in touch with either physician that he asked for.  However with some oral Benadryl and observation, it seems that he is slightly improving and certainly not worsening.  He has repeatedly declined steroids despite me indicating it would probably be beneficial.  I do not know what he is allergic to.  I will give him an EpiPen prescription for emergency use at home.  He is complaining that his throat is a little sore and so a COVID/flu test was obtained but is negative.  However at this point he has been observed for 4 hours and I think the swelling seems a little better he overall is feeling a little better and is stable for discharge home with return precautions.    Final Clinical Impression(s) / ED Diagnoses Final diagnoses:  Allergic reaction, initial encounter    Rx / DC Orders ED Discharge Orders          Ordered    EPINEPHrine 0.3 mg/0.3 mL IJ SOAJ injection  As needed        04/17/21 1235             Sherwood Gambler,  MD 04/17/21 1523

## 2021-04-22 ENCOUNTER — Encounter: Payer: Self-pay | Admitting: Allergy

## 2021-04-22 ENCOUNTER — Ambulatory Visit (INDEPENDENT_AMBULATORY_CARE_PROVIDER_SITE_OTHER): Payer: Medicare Other | Admitting: Allergy

## 2021-04-22 ENCOUNTER — Other Ambulatory Visit: Payer: Self-pay

## 2021-04-22 ENCOUNTER — Telehealth: Payer: Self-pay

## 2021-04-22 VITALS — BP 130/78 | HR 60 | Temp 98.0°F | Resp 16 | Ht 70.0 in | Wt 184.0 lb

## 2021-04-22 DIAGNOSIS — T7840XD Allergy, unspecified, subsequent encounter: Secondary | ICD-10-CM

## 2021-04-22 DIAGNOSIS — L509 Urticaria, unspecified: Secondary | ICD-10-CM | POA: Diagnosis not present

## 2021-04-22 DIAGNOSIS — T783XXD Angioneurotic edema, subsequent encounter: Secondary | ICD-10-CM

## 2021-04-22 DIAGNOSIS — T7840XA Allergy, unspecified, initial encounter: Secondary | ICD-10-CM | POA: Insufficient documentation

## 2021-04-22 DIAGNOSIS — T783XXA Angioneurotic edema, initial encounter: Secondary | ICD-10-CM | POA: Insufficient documentation

## 2021-04-22 NOTE — Patient Instructions (Addendum)
Allergic reaction Not sure what caused your symptoms.  Start zyrtec (cetirizine) 10mg  once a day. If symptoms are not controlled or causes drowsiness let us know. Avoid the following potential triggers: alcohol, tight clothing, NSAIDs, hot showers and getting overheated. Get bloodwork:  We are ordering labs, so please allow 1-2 weeks for the results to come back. With the newly implemented Cures Act, the labs might be visible to you at the same time that they become visible to me. However, I will not address the results until all of the results are back, so please be patient.    Call your cardiologist about changing your lisinopril to a different medication due to your recent swelling episode. For mild symptoms you can take over the counter antihistamines such as Benadryl and monitor symptoms closely. If symptoms worsen or if you have severe symptoms including breathing issues, throat closure, significant swelling, whole body hives, severe diarrhea and vomiting, lightheadedness then inject epinephrine and seek immediate medical care afterwards. Action plan given.   Follow up in 4 weeks or sooner if needed.

## 2021-04-22 NOTE — Progress Notes (Signed)
New Patient Note  RE: Andres [name] MRN: [id number] DOB: Nov 28, 1945 Date of Office Visit: 04/22/2021  Consult requested by: [name], MD Primary care provider: [name], MD  Chief Complaint: allergic reaction  History of Present Illness: I had the pleasure of seeing Andres [name] for initial evaluation at the [facility] [place] on 04/22/2021. He is a 75 y.o. male, who is self-referred here for the evaluation of allergic reaction.  Patient had cold like symptoms such as coughing, rhinorrhea for 2 weeks before his "allergic reaction" event. He had negative Covid-19/flu testing.  Denies any fevers/chills.   Patient had increased stress lately with the holidays. He was at a relative's house and had chocolate cake and chocolate for 2 days which he usually doesn't have.  He noted facial swelling, itching on the body and hives on the 20th and got worse by the 21st. He went to the ER on the 22nd due to throat swelling and change in his voice.   Patient stayed home and rested on the 23rd. This gradually improved and symptoms almost completely gone but has some residual itching currently.   Patient took some benadryl for a few days initially but no benadryl the past 3 days. He took tylenol for a few days leading up to this event due to the URI symptoms and headaches. He usually doesn't take tylenol but in the past no issues with taking it.  Suspected triggers are unknown - possibly stress, and chocolate. Denies any fevers, chills, changes in medications, personal care products.  He has tried the following therapies: benadryl prn with some benefit. Systemic steroids no - declined it in the ER.  Previous work up includes: none. Previous history of rash/hives/angioedema: swelling as a child and last episode was about 50 years ago. 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 He is not sure what type of work up was done for that in the past.  He noted that as a child  he would get worsening symptoms after taking aspirin. He is currently on aspirin 81mg  since his heart attack 3 years ago and follows with cardiology.  Family history of angioedema: no. Ace-inhibitor use: yes on lisinopril for 3 years after his heart attack.   He also had a sip of alcohol during communion and he normally does not drink. He also had a strenuous workout the day before the facial swelling.   Patient is up to date with the following cancer screening tests: physical exam, colonoscopy.  Reviewed video on the phone - lip angioedema noted.  Wife is away from home taking care of family member right now.  Patient declined prednisone as it makes him feel odd and doesn't like to take it.  He did pick up his Epipen from the pharmacy.   04/17/2021 ER visit: "75 year old male presents with lip swelling and change in voice.  This started yesterday.  At first it was just his top lips but now it is both lips.  He is also had swelling and itchiness to his arms and groin.  He is eating many different things that he wonders if they could be causing it though he has had these foods many times before.  Took some Benadryl last night.  After waking up he drank some coffee and then took a nap and when he woke up he was having a change in his voice feels like it is in his larynx.  He is not having trouble breathing and  is able to swallow. 40+ years ago he used to have swelling of his lips that he states hasn't recurred since he stopped using drugs.   Patient seems to have an allergic reaction with the hives in addition to lip swelling.  I have offered epi multiple times but he declines and wants me to call his doctor.  Unfortunately could not get in touch with either physician that he asked for.  However with some oral Benadryl and observation, it seems that he is slightly improving and certainly not worsening.  He has repeatedly declined steroids despite me indicating it would probably be beneficial.  I do  not know what he is allergic to.  I will give him an EpiPen prescription for emergency use at home.  He is complaining that his throat is a little sore and so a COVID/flu test was obtained but is negative.  However at this point he has been observed for 4 hours and I think the swelling seems a little better he overall is feeling a little better and is stable for discharge home with return precautions."  Assessment and Plan: [place] is a 75 y.o. male with: Allergic reaction Facial swelling, whole body pruritus and hives for a few days. Went to the ER due to voice change and significant lip angioedema. 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 Prior to this event he has been sick with a URI for which he took Tylenol. He also had a sip of alcohol and increased chocolate in his diet which are not normal for him. He also had a strenuous workout the day before. Patient is on lisinopril and aspirin 81mg  x 3 years s/p MI.  Discussed with patient that I'm not sure what caused his symptoms as he had so many new variables during that time.   Start zyrtec (cetirizine) 10mg  once a day. If symptoms are not controlled or causes drowsiness let us know. Avoid the following potential triggers: alcohol, tight clothing, NSAIDs, hot showers and getting overheated. Get bloodwork to rule out other etiologies. Given significant angioedema episode, recommend stopping ace-inhibitors. Advised patient to call his cardiologist regarding changing lisinopril.  For mild symptoms you can take over the counter antihistamines such as Benadryl and monitor symptoms closely. If symptoms worsen or if you have severe symptoms including breathing issues, throat closure, significant swelling, whole body hives, severe diarrhea and vomiting, lightheadedness then inject epinephrine and seek immediate medical care afterwards. Action plan given.   Return in about 4 weeks (around 05/20/2021).  No orders of the defined  types were placed in this encounter.  Lab Orders         Alpha-Gal Panel         ANA w/Reflex         C1 Esterase Inhibitor         C1 esterase inhibitor, functional         C3 and C4         Chronic Urticaria         Complement component c1q         Comprehensive metabolic panel         C-reactive protein         Sedimentation rate         Tryptase         Thyroid Cascade Profile         CBC with Differential/Platelet         Protein electrophoresis, serum  Protein Electrophoresis, Urine Rflx.         Chocolate IgE      Other allergy screening: Asthma: no Patient has "farmer's lung".  No prior inhaler use.  Rhino conjunctivitis: no Food allergy: no Past work up includes: no. Dietary History: patient has been eating other foods including milk, eggs, peanut, treenuts, sesame, shellfish, fish, soy, wheat, meats, fruits and vegetables.  Medication allergy: no Hymenoptera allergy: no Eczema:no History of recurrent infections suggestive of immunodeficency: no  Diagnostics: None.   Past Medical History: Patient Active Problem List   Diagnosis Date Noted   Allergic reaction 04/22/2021   Angio-edema 04/22/2021   Urticaria 04/22/2021   Nausea    Chest pain 07/21/2017   Past Medical History:  Diagnosis Date   Coronary artery disease    Hypertension    NSTEMI (non-ST elevated myocardial infarction) ([place]) 3/209   Past Surgical History: Past Surgical History:  Procedure Laterality Date   APPENDECTOMY     CORONARY STENT INTERVENTION     FOOT SURGERY     HERNIA REPAIR     KNEE CARTILAGE SURGERY Right    ROTATOR CUFF REPAIR     Medication List:  Current Outpatient Medications  Medication Sig Dispense Refill   aspirin 81 MG chewable tablet Chew 81 mg by mouth daily.     atorvastatin (LIPITOR) 80 MG tablet Take 80 mg by mouth daily.     carvedilol (COREG) 3.125 MG tablet Take 3.125 mg by mouth 2 (two) times daily with a meal.     EPINEPHrine 0.3 mg/0.3 mL IJ  SOAJ injection Inject 0.3 mg into the muscle as needed for anaphylaxis. 1 each 1   lisinopril (PRINIVIL,ZESTRIL) 5 MG tablet Take 2 tablets (10 mg total) by mouth daily. 30 tablet 0   nitroGLYCERIN (NITROSTAT) 0.4 MG SL tablet Place 0.4 mg under the tongue every 5 (five) minutes as needed for chest pain.     prasugrel (EFFIENT) 10 MG TABS tablet Take 10 mg by mouth daily.     No current facility-administered medications for this visit.   Allergies: No Known Allergies Social History: Social History   Socioeconomic History   Marital status: Married    Spouse name: Not on file   Number of children: Not on file   Years of education: Not on file   Highest education level: Not on file  Occupational History   Not on file  Tobacco Use   Smoking status: Former   Smokeless tobacco: Never  Vaping Use   Vaping Use: Never used  Substance and Sexual Activity   Alcohol use: Not Currently   Drug use: Never   Sexual activity: Not on file  Other Topics Concern   Not on file  Social History Narrative   Not on file   Social Determinants of Health   Financial Resource Strain: Not on file  Food Insecurity: Not on file  Transportation Needs: Not on file  Physical Activity: Not on file  Stress: Not on file  Social Connections: Not on file   Lives in a house. Smoking: quit 30 years ago Occupation: retired - Training and development officer  Environmental History: Water Damage/mildew in the house: no Charity fundraiser in the family room: yes Carpet in the bedroom: yes Heating: gas Cooling: central Pet: yes 2 dogs x 4 yrs  Family History: History reviewed. No pertinent family history. Problem  Relation Asthma                                   no Eczema                                no Food allergy                          no Allergic rhino conjunctivitis     no Urticaria   ? Father  Angioedema    No   Review of Systems  Constitutional:  Negative for appetite change,  chills, fever and unexpected weight change.  HENT:  Negative for congestion and rhinorrhea.        Facial swelling.   Eyes:  Negative for itching.  Respiratory:  Negative for cough, chest tightness, shortness of breath and wheezing.   Cardiovascular:  Negative for chest pain.  Gastrointestinal:  Negative for abdominal pain.  Genitourinary:  Negative for difficulty urinating.  Skin:  Positive for rash.  Neurological:  Negative for headaches.   Objective: BP 130/78    Pulse 60    Temp 98 F (36.7 C) (Temporal)    Resp 16    Ht 5\' 10"  (1.778 m)    Wt 184 lb (83.5 kg)    SpO2 98%    BMI 26.40 kg/m  Body mass index is 26.4 kg/m. Physical Exam Vitals and nursing note reviewed.  Constitutional:      Appearance: Normal appearance. He is well-developed.  HENT:     Head: Normocephalic and atraumatic.     Right Ear: Tympanic membrane and external ear normal.     Left Ear: Tympanic membrane and external ear normal.     Nose: Nose normal.     Mouth/Throat:     Mouth: Mucous membranes are moist.     Pharynx: Oropharynx is clear.  Eyes:     Conjunctiva/sclera: Conjunctivae normal.  Cardiovascular:     Rate and Rhythm: Normal rate and regular rhythm.     Heart sounds: Normal heart sounds. No murmur heard.   No friction rub. No gallop.  Pulmonary:     Effort: Pulmonary effort is normal.     Breath sounds: Normal breath sounds. No wheezing, rhonchi or rales.  Musculoskeletal:     Cervical back: Neck supple.  Skin:    General: Skin is warm.     Findings: No rash.  Neurological:     Mental Status: He is alert and oriented to person, place, and time.  Psychiatric:        Behavior: Behavior normal.   The plan was reviewed with the patient/family, and all questions/concerned were addressed.  It was my pleasure to see [place] today and participate in his care. Please feel free to contact me with any questions or concerns.  Sincerely,  Rexene Alberts, DO Allergy & Immunology  [facility] office: [place] office: [phone]

## 2021-04-22 NOTE — Assessment & Plan Note (Addendum)
Facial swelling, whole body pruritus and hives for a few days. Went to the ER due to voice change and significant lip angioedema. 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 Prior to this event he has been sick with a URI for which he took Tylenol. He also had a sip of alcohol and increased chocolate in his diet which are not normal for him. He also had a strenuous workout the day before. Patient is on lisinopril and aspirin 81mg  x 3 years s/p MI.   Discussed with patient that I'm not sure what caused his symptoms as he had so many new variables during that time.    Start zyrtec (cetirizine) 10mg  once a day.  If symptoms are not controlled or causes drowsiness let us know.  Avoid the following potential triggers: alcohol, tight clothing, NSAIDs, hot showers and getting overheated.  Get bloodwork to rule out other etiologies.  Given significant angioedema episode, recommend stopping ace-inhibitors. o Advised patient to call his cardiologist regarding changing lisinopril.   For mild symptoms you can take over the counter antihistamines such as Benadryl and monitor symptoms closely. If symptoms worsen or if you have severe symptoms including breathing issues, throat closure, significant swelling, whole body hives, severe diarrhea and vomiting, lightheadedness then inject epinephrine and seek immediate medical care afterwards.  Action plan given.

## 2021-04-22 NOTE — Telephone Encounter (Signed)
Patient was in the Emergency Room on the 04/17/21 because of allergic reaction lips and face swelling and then moved to his throat, Benadryl, didn't take steroids because he has bad reactions to them. Andres [name] thought maybe it came from eating chocolate, he also thinks it could've came from nerves and stress. Gradually has gotten better, but he still feels itchy all over, lips really sore. Patient hasn't been seen in our clinic he would be considered a new patient.

## 2021-05-01 ENCOUNTER — Ambulatory Visit: Payer: Medicare Other | Admitting: Allergy

## 2021-05-07 ENCOUNTER — Other Ambulatory Visit: Payer: Self-pay | Admitting: Allergy

## 2021-05-07 DIAGNOSIS — T783XXD Angioneurotic edema, subsequent encounter: Secondary | ICD-10-CM

## 2021-05-07 LAB — CBC WITH DIFFERENTIAL/PLATELET
Basophils Absolute: 0 10*3/uL (ref 0.0–0.2)
Basos: 0 %
EOS (ABSOLUTE): 0.2 10*3/uL (ref 0.0–0.4)
Eos: 3 %
Hematocrit: 42.7 % (ref 37.5–51.0)
Hemoglobin: 14.1 g/dL (ref 13.0–17.7)
Immature Grans (Abs): 0 10*3/uL (ref 0.0–0.1)
Immature Granulocytes: 1 %
Lymphocytes Absolute: 1.2 10*3/uL (ref 0.7–3.1)
Lymphs: 18 %
MCH: 29.4 pg (ref 26.6–33.0)
MCHC: 33 g/dL (ref 31.5–35.7)
MCV: 89 fL (ref 79–97)
Monocytes Absolute: 0.3 10*3/uL (ref 0.1–0.9)
Monocytes: 5 %
Neutrophils Absolute: 4.6 10*3/uL (ref 1.4–7.0)
Neutrophils: 73 %
Platelets: 258 10*3/uL (ref 150–450)
RBC: 4.79 x10E6/uL (ref 4.14–5.80)
RDW: 13.1 % (ref 11.6–15.4)
WBC: 6.3 10*3/uL (ref 3.4–10.8)

## 2021-05-07 LAB — ALPHA-GAL PANEL
Allergen Lamb IgE: <0.1 kU/L
Beef IgE: <0.1 kU/L
IgE (Immunoglobulin E), Serum: 740 IU/mL — ABNORMAL HIGH (ref 6–495)
O215-IgE Alpha-Gal: 0.14 kU/L — AB
Pork IgE: <0.1 kU/L

## 2021-05-07 LAB — C1 ESTERASE INHIBITOR, FUNCTIONAL: C1INH Functional/C1INH Total MFr SerPl: 92 %mean normal

## 2021-05-07 LAB — COMPREHENSIVE METABOLIC PANEL
ALT: 20 IU/L (ref 0–44)
AST: 22 IU/L (ref 0–40)
Albumin/Globulin Ratio: 1.6 (ref 1.2–2.2)
Albumin: 3.9 g/dL (ref 3.7–4.7)
Alkaline Phosphatase: 90 IU/L (ref 44–121)
BUN/Creatinine Ratio: 13 (ref 10–24)
BUN: 12 mg/dL (ref 8–27)
Bilirubin Total: 0.6 mg/dL (ref 0.0–1.2)
CO2: 26 mmol/L (ref 20–29)
Calcium: 9 mg/dL (ref 8.6–10.2)
Chloride: 102 mmol/L (ref 96–106)
Creatinine, Ser: 0.91 mg/dL (ref 0.76–1.27)
Globulin, Total: 2.4 g/dL (ref 1.5–4.5)
Glucose: 106 mg/dL — ABNORMAL HIGH (ref 70–99)
Potassium: 4.1 mmol/L (ref 3.5–5.2)
Sodium: 139 mmol/L (ref 134–144)
Total Protein: 6.3 g/dL (ref 6.0–8.5)
eGFR: 89 mL/min/{1.73_m2} (ref >59)

## 2021-05-07 LAB — PROTEIN ELECTROPHORESIS, URINE REFLEX
Albumin ELP, Urine: 100 %
Alpha-1-Globulin, U: 0 %
Alpha-2-Globulin, U: 0 %
Beta Globulin, U: 0 %
Gamma Globulin, U: 0 %
Protein, Ur: 6.4 mg/dL

## 2021-05-07 LAB — PROTEIN ELECTROPHORESIS, SERUM
A/G Ratio: 1.2 (ref 0.7–1.7)
Albumin ELP: 3.4 g/dL (ref 2.9–4.4)
Alpha 1: 0.2 g/dL (ref 0.0–0.4)
Alpha 2: 0.9 g/dL (ref 0.4–1.0)
Beta: 1 g/dL (ref 0.7–1.3)
Gamma Globulin: 0.9 g/dL (ref 0.4–1.8)
Globulin, Total: 2.9 g/dL (ref 2.2–3.9)

## 2021-05-07 LAB — ANA W/REFLEX: Anti Nuclear Antibody (ANA): NEGATIVE

## 2021-05-07 LAB — C3 AND C4
Complement C3, Serum: 155 mg/dL (ref 82–167)
Complement C4, Serum: 28 mg/dL (ref 12–38)

## 2021-05-07 LAB — SEDIMENTATION RATE: Sed Rate: 8 mm/hr (ref 0–30)

## 2021-05-07 LAB — CHRONIC URTICARIA: cu index: <3.3 (ref <10)

## 2021-05-07 LAB — ALLERGEN CHOCOLATE: Chocolate/Cacao IgE: <0.1 kU/L

## 2021-05-07 LAB — COMPLEMENT COMPONENT C1Q: Complement C1Q: 7.6 mg/dL — ABNORMAL LOW (ref 10.2–20.3)

## 2021-05-07 LAB — THYROID CASCADE PROFILE: TSH: 0.961 u[IU]/mL (ref 0.450–4.500)

## 2021-05-07 LAB — C1 ESTERASE INHIBITOR: C1INH SerPl-mCnc: 32 mg/dL (ref 21–39)

## 2021-05-07 LAB — TRYPTASE: Tryptase: 8.9 ug/L (ref 2.2–13.2)

## 2021-05-07 LAB — C-REACTIVE PROTEIN: CRP: 9 mg/L (ref 0–10)

## 2021-06-02 NOTE — Progress Notes (Deleted)
Follow Up Note  RE: Andres [name] MRN: [id number] DOB: [birth year] Date of Office Visit: 06/03/2021  Referring provider: [name], MD Primary care provider: [name], MD  Chief Complaint: No chief complaint on file.  History of Present Illness: I had the pleasure of seeing Andres [name] for a follow up visit at the [facility] [place] on 06/02/2021. He is a 76 y.o. male, who is being followed for allergic reaction. His previous allergy office visit was on 04/22/2021 with Dr. Maudie Mercury. Today is a regular follow up visit.  I reviewed the bloodwork. Blood count, kidney function, liver function, electrolytes, thyroid, autoimmune screener, inflammation markers, chronic urticaria index (checks for autoantibodies that trigger mast cells), tryptase (checks for mast cell issues) were all normal which is great.    Urine test was normal. Negative to chocolate.    Alpha gal (checks for red meat allergy) was slightly elevated. See if symptoms worsen after eating red meat.   Complement C1Q was lower than normal. We need to recheck this value to make sure it is an accurate measurement. I will put in orders in the computer - please have patient get the repeat bloodwork drawn before the next visit.   Keep February appointment.   Allergic reaction Facial swelling, whole body pruritus and hives for a few days. Went to the ER due to voice change and significant lip angioedema. 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 Prior to this event he has been sick with a URI for which he took Tylenol. He also had a sip of alcohol and increased chocolate in his diet which are not normal for him. He also had a strenuous workout the day before. Patient is on lisinopril and aspirin 81mg  x 3 years s/p MI.  Discussed with patient that I'm not sure what caused his symptoms as he had so many new variables during that time.   Start zyrtec (cetirizine) 10mg  once a day. If  symptoms are not controlled or causes drowsiness let us know. Avoid the following potential triggers: alcohol, tight clothing, NSAIDs, hot showers and getting overheated. Get bloodwork to rule out other etiologies. Given significant angioedema episode, recommend stopping ace-inhibitors. Advised patient to call his cardiologist regarding changing lisinopril.  For mild symptoms you can take over the counter antihistamines such as Benadryl and monitor symptoms closely. If symptoms worsen or if you have severe symptoms including breathing issues, throat closure, significant swelling, whole body hives, severe diarrhea and vomiting, lightheadedness then inject epinephrine and seek immediate medical care afterwards. Action plan given.    Return in about 4 weeks (around 05/20/2021).   No orders of the defined types were placed in this encounter.   Lab Orders         Alpha-Gal Panel         ANA w/Reflex         C1 Esterase Inhibitor         C1 esterase inhibitor, functional         C3 and C4         Chronic Urticaria         Complement component c1q         Comprehensive metabolic panel         C-reactive protein         Sedimentation rate         Tryptase         Thyroid Cascade Profile  CBC with Differential/Platelet         Protein electrophoresis, serum         Protein Electrophoresis, Urine Rflx.         Chocolate IgE     Assessment and Plan: Andres [name] is a 76 y.o. male with: No problem-specific Assessment & Plan notes found for this encounter.  No follow-ups on file.  No orders of the defined types were placed in this encounter.  Lab Orders  No laboratory test(s) ordered today    Diagnostics: Spirometry:  Tracings reviewed. His effort: {Blank single:19197::"Good reproducible efforts.","It was hard to get consistent efforts and there is a question as to whether this reflects a maximal maneuver.","Poor effort, data can not be interpreted."} FVC: ***L FEV1: ***L, ***%  predicted FEV1/FVC ratio: ***% Interpretation: {Blank single:19197::"Spirometry consistent with mild obstructive disease","Spirometry consistent with moderate obstructive disease","Spirometry consistent with severe obstructive disease","Spirometry consistent with possible restrictive disease","Spirometry consistent with mixed obstructive and restrictive disease","Spirometry uninterpretable due to technique","Spirometry consistent with normal pattern","No overt abnormalities noted given today's efforts"}.  Please see scanned spirometry results for details.  Skin Testing: {Blank single:19197::"Select foods","Environmental allergy panel","Environmental allergy panel and select foods","Food allergy panel","None","Deferred due to recent antihistamines use"}. *** Results discussed with patient/family.   Medication List:  Current Outpatient Medications  Medication Sig Dispense Refill   aspirin 81 MG chewable tablet Chew 81 mg by mouth daily.     atorvastatin (LIPITOR) 80 MG tablet Take 80 mg by mouth daily.     carvedilol (COREG) 3.125 MG tablet Take 3.125 mg by mouth 2 (two) times daily with a meal.     EPINEPHrine 0.3 mg/0.3 mL IJ SOAJ injection Inject 0.3 mg into the muscle as needed for anaphylaxis. 1 each 1   lisinopril (PRINIVIL,ZESTRIL) 5 MG tablet Take 2 tablets (10 mg total) by mouth daily. 30 tablet 0   nitroGLYCERIN (NITROSTAT) 0.4 MG SL tablet Place 0.4 mg under the tongue every 5 (five) minutes as needed for chest pain.     prasugrel (EFFIENT) 10 MG TABS tablet Take 10 mg by mouth daily.     No current facility-administered medications for this visit.   Allergies: No Known Allergies I reviewed his past medical history, social history, family history, and environmental history and no significant changes have been reported from his previous visit.  Review of Systems  Constitutional:  Negative for appetite change, chills, fever and unexpected weight change.  HENT:  Negative for  congestion and rhinorrhea.        Facial swelling.   Eyes:  Negative for itching.  Respiratory:  Negative for cough, chest tightness, shortness of breath and wheezing.   Cardiovascular:  Negative for chest pain.  Gastrointestinal:  Negative for abdominal pain.  Genitourinary:  Negative for difficulty urinating.  Skin:  Positive for rash.  Neurological:  Negative for headaches.   Objective: There were no vitals taken for this visit. There is no height or weight on file to calculate BMI. Physical Exam Vitals and nursing note reviewed.  Constitutional:      Appearance: Normal appearance. He is well-developed.  HENT:     Head: Normocephalic and atraumatic.     Right Ear: Tympanic membrane and external ear normal.     Left Ear: Tympanic membrane and external ear normal.     Nose: Nose normal.     Mouth/Throat:     Mouth: Mucous membranes are moist.     Pharynx: Oropharynx is clear.  Eyes:     Conjunctiva/sclera: Conjunctivae normal.  Cardiovascular:     Rate and Rhythm: Normal rate and regular rhythm.     Heart sounds: Normal heart sounds. No murmur heard.   No friction rub. No gallop.  Pulmonary:     Effort: Pulmonary effort is normal.     Breath sounds: Normal breath sounds. No wheezing, rhonchi or rales.  Musculoskeletal:     Cervical back: Neck supple.  Skin:    General: Skin is warm.     Findings: No rash.  Neurological:     Mental Status: He is alert and oriented to person, place, and time.  Psychiatric:        Behavior: Behavior normal.   Previous notes and tests were reviewed. The plan was reviewed with the patient/family, and all questions/concerned were addressed.  It was my pleasure to see Andres [name] today and participate in his care. Please feel free to contact me with any questions or concerns.  Sincerely,  Rexene Alberts, DO Allergy & Immunology  [facility] office: [place] office: [phone]

## 2021-06-03 ENCOUNTER — Ambulatory Visit: Payer: Medicare Other | Admitting: Allergy

## 2021-06-03 DIAGNOSIS — T7840XD Allergy, unspecified, subsequent encounter: Secondary | ICD-10-CM

## 2021-07-03 ENCOUNTER — Encounter: Payer: Self-pay | Admitting: Allergy

## 2021-07-03 ENCOUNTER — Ambulatory Visit (INDEPENDENT_AMBULATORY_CARE_PROVIDER_SITE_OTHER): Payer: Medicare Other | Admitting: Allergy

## 2021-07-03 ENCOUNTER — Other Ambulatory Visit: Payer: Self-pay

## 2021-07-03 VITALS — BP 120/74 | HR 71 | Temp 99.4°F | Resp 18

## 2021-07-03 DIAGNOSIS — T7840XD Allergy, unspecified, subsequent encounter: Secondary | ICD-10-CM

## 2021-07-03 DIAGNOSIS — T783XXD Angioneurotic edema, subsequent encounter: Secondary | ICD-10-CM

## 2021-07-03 DIAGNOSIS — L509 Urticaria, unspecified: Secondary | ICD-10-CM | POA: Diagnosis not present

## 2021-07-03 NOTE — Patient Instructions (Addendum)
Allergic reaction ?Continue to avoid lisinopril.  ?Avoid the following potential triggers: alcohol, tight clothing, NSAIDs, hot showers and getting overheated. ?If you have another episodes let us know and take pictures. ?Get bloodwork:  ?We are ordering labs, so please allow 1-2 weeks for the results to come back. ?With the newly implemented Cures Act, the labs might be visible to you at the same time that they become visible to me. However, I will not address the results until all of the results are back, so please be patient.   ? ?For mild symptoms you can take over the counter antihistamines such as Benadryl and monitor symptoms closely. If symptoms worsen or if you have severe symptoms including breathing issues, throat closure, significant swelling, whole body hives, severe diarrhea and vomiting, lightheadedness then inject epinephrine and seek immediate medical care afterwards. ?Action plan in place. ? ?Follow up as needed depending on bloodwork results.  ?

## 2021-07-03 NOTE — Assessment & Plan Note (Addendum)
Past history - Facial swelling, whole body pruritus and hives for a few days. Went to the ER due to voice change and significant lip angioedema. 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 Prior to this event he has been sick with a URI for which he took Tylenol. He also had a sip of alcohol and increased chocolate in his diet which are not normal for him. He also had a strenuous workout the day before. 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 ?Interim history - stopped lisinopril and no more episodes. 2022 bloodwork showed low C1Q, borderline positive to alpha gal. TSH not ran due to coverage issues. Tolerates chocolate and red meat with no issues.  ?? Continue to avoid lisinopril.  ?? Avoid the following potential triggers: alcohol, tight clothing, NSAIDs, hot showers and getting overheated. ?? If you have another episodes let us know and take pictures. ?? Get bloodwork to check C1Q level.  ?? For mild symptoms you can take over the counter antihistamines such as Benadryl and monitor symptoms closely. If symptoms worsen or if you have severe symptoms including breathing issues, throat closure, significant swelling, whole body hives, severe diarrhea and vomiting, lightheadedness then inject epinephrine and seek immediate medical care afterwards. ?? Action plan in place. ?

## 2021-07-03 NOTE — Progress Notes (Signed)
? ?Follow Up Note ? ?RE: Andres BURROUGHS MRN: [id number] DOB: 03/01/1946 ?Date of Office Visit: 07/03/2021 ? ?Referring provider: [name], MD ?Primary care provider: [name], MD ? ?Chief Complaint: No chief complaint on file. ? ?History of Present Illness: ?I had the pleasure of seeing Andres [name] for a follow up visit at the [facility] on 07/03/2021. He is a 76 y.o. male, who is being followed for allergic reactions. His previous allergy office visit was on 04/22/2021 with Dr. Maudie Mercury. Today is a regular follow up visit. ? ?Allergic reaction ? ?Patient stopped lisinopril and switched to a new blood pressure medication (amlodipine) the day of the last appointment and has not had any issues since then.  ? ?Patient is in a study for ozempic once a week - he is not sure if he's getting the study or placebo drug.  ? ?Patient initially took zyrtec but then ran out and now on allegra '180mg'$  daily. No issues when misses a dose.  ? ?Tolerating chocolate and red meat with no issues. ? ?Up to date with physical exam, colonoscopy and no history of cancer. ? 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  ? ?Assessment and Plan: ?Andres [name] is a 76 y.o. male with: ?Allergic reaction, angioedema ?Past history - Facial swelling, whole body pruritus and hives for a few days. Went to the ER due to voice change and significant lip angioedema. 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 Prior to this event he has been sick with a URI for which he took Tylenol. He also had a sip of alcohol and increased chocolate in his diet which are not normal for him. He also had a strenuous workout the day before. 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 ?Interim history - stopped lisinopril and no more episodes. 2022 bloodwork showed low C1Q, borderline positive to alpha gal. TSH not ran due to  coverage issues. Tolerates chocolate and red meat with no issues.  ?Continue to avoid lisinopril.  ?Avoid the following potential triggers: alcohol, tight clothing, NSAIDs, hot showers and getting overheated. ?If you have another episodes let Andres [name] know and take pictures. ?Get bloodwork to check C1Q level.  ?For mild symptoms you can take over the counter antihistamines such as Benadryl and monitor symptoms closely. If symptoms worsen or if you have severe symptoms including breathing issues, throat closure, significant swelling, whole body hives, severe diarrhea and vomiting, lightheadedness then inject epinephrine and seek immediate medical care afterwards. ?Action plan in place. ? ?Return if symptoms worsen or fail to improve. ? ?No orders of the defined types were placed in this encounter. ? ?Lab Orders    ?     Complement component c1q    ? ? ?Diagnostics: ?None ? ?Medication List:  ?Current Outpatient Medications  ?Medication Sig Dispense Refill  ? amLODipine (NORVASC) 10 MG tablet Take 1 tablet by mouth daily.    ? aspirin 81 MG chewable tablet Chew 81 mg by mouth daily.    ? atorvastatin (LIPITOR) 80 MG tablet Take 80 mg by mouth daily.    ? EPINEPHrine 0.3 mg/0.3 mL IJ SOAJ injection Inject 0.3 mg into the muscle as needed for anaphylaxis. 1 each 1  ? gabapentin (NEURONTIN) 300 MG capsule Take by mouth.    ? metoprolol succinate (TOPROL-XL) 25  MG 24 hr tablet Take 25 mg by mouth daily.    ? nitroGLYCERIN (NITROSTAT) 0.4 MG SL tablet Place 0.4 mg under the tongue every 5 (five) minutes as needed for chest pain.    ? rivaroxaban (XARELTO) 20 MG TABS tablet Take by mouth.    ? ?No current facility-administered medications for this visit.  ? ?Allergies: ?Allergies  ?Allergen Reactions  ? Lisinopril Swelling and Other (See Comments)  ?  Angioedema  ?Angioedema  ?  ? ?I reviewed his past medical history, social history, family history, and environmental history and no significant changes have been reported from his  previous visit. ? ?Review of Systems  ?Constitutional:  Negative for appetite change, chills, fever and unexpected weight change.  ?HENT:  Negative for congestion and rhinorrhea.   ?Eyes:  Negative for itching.  ?Respiratory:  Negative for cough, chest tightness, shortness of breath and wheezing.   ?Cardiovascular:  Negative for chest pain.  ?Gastrointestinal:  Negative for abdominal pain.  ?Genitourinary:  Negative for difficulty urinating.  ?Skin:  Negative for rash.  ?Neurological:  Negative for headaches.  ? ?Objective: ?BP 120/74   Pulse 71   Temp 99.4 ?F (37.4 ?C) (Temporal)   Resp 18   SpO2 98%  ?There is no height or weight on file to calculate BMI. ?Physical Exam ?Vitals and nursing note reviewed.  ?Constitutional:   ?   Appearance: Normal appearance. He is well-developed.  ?HENT:  ?   Head: Normocephalic and atraumatic.  ?   Right Ear: Tympanic membrane and external ear normal.  ?   Left Ear: Tympanic membrane and external ear normal.  ?   Nose: Nose normal.  ?   Mouth/Throat:  ?   Mouth: Mucous membranes are moist.  ?   Pharynx: Oropharynx is clear.  ?Eyes:  ?   Conjunctiva/sclera: Conjunctivae normal.  ?Cardiovascular:  ?   Rate and Rhythm: Normal rate and regular rhythm.  ?   Heart sounds: Normal heart sounds. No murmur heard. ?  No friction rub. No gallop.  ?Pulmonary:  ?   Effort: Pulmonary effort is normal.  ?   Breath sounds: Normal breath sounds. No wheezing, rhonchi or rales.  ?Musculoskeletal:  ?   Cervical back: Neck supple.  ?Skin: ?   General: Skin is warm.  ?   Findings: No rash.  ?Neurological:  ?   Mental Status: He is alert and oriented to person, place, and time.  ?Psychiatric:     ?   Behavior: Behavior normal.  ? ?Previous notes and tests were reviewed. ?The plan was reviewed with the patient/family, and all questions/concerned were addressed. ? ?It was my pleasure to see [name] today and participate in his care. Please feel free to contact me with any questions or  concerns. ? ?Sincerely, ? ?Rexene Alberts, DO ?Allergy & Immunology ? ?[facility] ?[place] office: [phone] ?[place] office: [phone] ?

## 2021-07-15 ENCOUNTER — Encounter: Payer: Self-pay | Admitting: Allergy

## 2021-07-16 LAB — COMPLEMENT COMPONENT C1Q: Complement C1Q: 11.4 mg/dL (ref 10.2–20.3)

## 2021-10-22 ENCOUNTER — Emergency Department (HOSPITAL_BASED_OUTPATIENT_CLINIC_OR_DEPARTMENT_OTHER)
Admission: EM | Admit: 2021-10-22 | Discharge: 2021-10-22 | Disposition: A | Discharge status: Home / Self Care | Payer: Medicare Other | Attending: Emergency Medicine | Admitting: Emergency Medicine

## 2021-10-22 ENCOUNTER — Encounter (HOSPITAL_BASED_OUTPATIENT_CLINIC_OR_DEPARTMENT_OTHER): Payer: Self-pay | Admitting: Emergency Medicine

## 2021-10-22 ENCOUNTER — Other Ambulatory Visit: Payer: Self-pay

## 2021-10-22 DIAGNOSIS — Z7982 Long term (current) use of aspirin: Secondary | ICD-10-CM | POA: Diagnosis not present

## 2021-10-22 DIAGNOSIS — Z7901 Long term (current) use of anticoagulants: Secondary | ICD-10-CM | POA: Diagnosis not present

## 2021-10-22 DIAGNOSIS — R000 Tachycardia, unspecified: Secondary | ICD-10-CM | POA: Diagnosis present

## 2021-10-22 DIAGNOSIS — Z79899 Other long term (current) drug therapy: Secondary | ICD-10-CM | POA: Diagnosis not present

## 2021-10-22 NOTE — ED Triage Notes (Signed)
Pt arrives to ED with c/o possible Afib and tachycardia. Pt reports he played basketball today and after he finished his Fitbit shows that his heart rate is abnormal with rate up to the 130s.

## 2021-10-22 NOTE — Discharge Instructions (Signed)
Follow-up with your cardiologist or your primary care doctor.  Come back to ER if you develop any palpitations, chest pain, difficulty breathing or other new concerning symptom.

## 2021-10-22 NOTE — ED Provider Notes (Signed)
[facility] EMERGENCY DEPT Provider Note   CSN: [id number] Arrival date & time: 10/22/21  1505     History  Chief Complaint  Patient presents with   Tachycardia    Andres [name] is a 76 y.o. male.  Presents emergency department due to concern for fast heart rate.  Patient reports he has a history of A-fib but had previously underwent ablation, normally is in sinus rhythm.  He does take Xarelto for anticoagulation.  After playing basketball he noticed on his Fitbit that his heart rate was in the 130s.  After resting it continued to stay elevated and he decided to get checked out to make sure he was not in A-fib.  He does not have any sort of symptoms right now, feels well.  Denies any sort of palpitations or difficulty breathing.  Visit today was prompted only because of what his Fitbit read.  Since being hooked up to the cardiac monitor patient has noticed a significant discrepancy between what his watch is reporting and what our cardiac monitor reports. HPI     Home Medications Prior to Admission medications   Medication Sig Start Date End Date Taking? Authorizing Provider  amLODipine (NORVASC) 10 MG tablet Take 1 tablet by mouth daily. 05/27/21   [provider]  aspirin 81 MG chewable tablet Chew 81 mg by mouth daily.    [provider]  atorvastatin (LIPITOR) 80 MG tablet Take 80 mg by mouth daily.    [provider]  EPINEPHrine 0.3 mg/0.3 mL IJ SOAJ injection Inject 0.3 mg into the muscle as needed for anaphylaxis. 04/17/21   Sherwood Gambler, MD  gabapentin (NEURONTIN) 300 MG capsule Take by mouth. 08/12/20   [provider]  metoprolol succinate (TOPROL-XL) 25 MG 24 hr tablet Take 25 mg by mouth daily. 06/13/21   [provider]  nitroGLYCERIN (NITROSTAT) 0.4 MG SL tablet Place 0.4 mg under the tongue every 5 (five) minutes as needed for chest pain.    [provider]  rivaroxaban (XARELTO) 20 MG TABS tablet  Take by mouth. 04/28/21   [provider]      Allergies    Lisinopril    Review of Systems   Review of Systems  All other systems reviewed and are negative.   Physical Exam Updated Vital Signs BP 126/82   Pulse 71   Temp 98.1 F (36.7 C) (Oral)   Resp 13   Ht '5\' 9"'$  (1.753 m)   Wt 78 kg   SpO2 94%   BMI 25.40 kg/m  Physical Exam Vitals and nursing note reviewed.  Constitutional:      General: He is not in acute distress.    Appearance: He is well-developed.  HENT:     Head: Normocephalic and atraumatic.  Eyes:     Conjunctiva/sclera: Conjunctivae normal.  Cardiovascular:     Rate and Rhythm: Normal rate.     Pulses: Normal pulses.  Pulmonary:     Effort: Pulmonary effort is normal. No respiratory distress.     Breath sounds: Normal breath sounds.  Abdominal:     Palpations: Abdomen is soft.     Tenderness: There is no abdominal tenderness.  Musculoskeletal:        General: No swelling.     Cervical back: Neck supple.  Skin:    General: Skin is warm and dry.     Capillary Refill: Capillary refill takes less than 2 seconds.  Neurological:     Mental Status: He is  alert.  Psychiatric:        Mood and Affect: Mood normal.     ED Results / Procedures / Treatments   Labs (all labs ordered are listed, but only abnormal results are displayed) Labs Reviewed - No data to display  EKG EKG Interpretation  Date/Time:  Wednesday October 22 2021 15:22:46 EDT Ventricular Rate:  87 PR Interval:  136 QRS Duration: 82 QT Interval:  384 QTC Calculation: 462 R Axis:   44 Text Interpretation: Sinus rhythm with occasional Premature ventricular complexes and Premature atrial complexes Otherwise normal ECG When compared with ECG of 17-Apr-2021 08:44, PREVIOUS ECG IS PRESENT Confirmed by Madalyn Rob [phone]) on 10/22/2021 4:59:29 PM  Radiology No results found.  Procedures Procedures    Medications Ordered in ED Medications - No data to display  ED  Course/ Medical Decision Making/ A&P                           Medical Decision Making  76 year old male presenting for concern of possible atrial fibrillation.  He endorses prior history of atrial fibrillation s/p ablation, on Xarelto.  Reports his smart watch was showing elevated heart rate and he was concerned about recurrence of atrial fibrillation.  He consistently denied any sort of concerning symptoms today.  His EKG demonstrates a normal sinus rhythm.  I reviewed his cardiac monitor and he is remaining in a sinus rhythm.  When compared to what his smart watch is currently reporting that there is a significant discrepancy with our cardiac monitor.  Given patient has no ongoing symptoms, I do not see indication for any testing such as lab work or imaging today.  Advised patient to follow-up with his regular doctors.  Reviewed return precautions should he develop symptoms, discharged.    After the discussed management above, the patient was determined to be safe for discharge.  The patient was in agreement with this plan and all questions regarding their care were answered.  ED return precautions were discussed and the patient will return to the ED with any significant worsening of condition.         Final Clinical Impression(s) / ED Diagnoses Final diagnoses:  Heart rate fast    Rx / DC Orders ED Discharge Orders     None         Lucrezia Starch, MD 10/22/21 1700

## 2021-11-21 ENCOUNTER — Encounter: Payer: Self-pay | Admitting: Gastroenterology

## 2021-12-17 ENCOUNTER — Encounter: Payer: Medicare Other | Admitting: Gastroenterology

## 2021-12-22 ENCOUNTER — Encounter: Payer: Medicare Other | Admitting: Gastroenterology

## 2022-01-05 ENCOUNTER — Ambulatory Visit: Payer: Medicare Other | Admitting: Gastroenterology

## 2022-02-13 ENCOUNTER — Encounter: Payer: Self-pay | Admitting: Gastroenterology

## 2022-02-13 ENCOUNTER — Ambulatory Visit (INDEPENDENT_AMBULATORY_CARE_PROVIDER_SITE_OTHER): Payer: Medicare Other | Admitting: Gastroenterology

## 2022-02-13 VITALS — BP 126/92 | HR 89 | Ht 69.0 in | Wt 174.0 lb

## 2022-02-13 DIAGNOSIS — Z7902 Long term (current) use of antithrombotics/antiplatelets: Secondary | ICD-10-CM | POA: Diagnosis not present

## 2022-02-13 DIAGNOSIS — Z86010 Personal history of colonic polyps: Secondary | ICD-10-CM | POA: Diagnosis not present

## 2022-02-13 MED ORDER — NA SULFATE-K SULFATE-MG SULF 17.5-3.13-1.6 GM/177ML PO SOLN: 1.0000 | Freq: Once | ORAL | 0 refills | Status: AC

## 2022-02-13 NOTE — Progress Notes (Addendum)
Referring Provider: [name], MD Primary Care Physician:  [name], MD  Reason for Consultation: Colon cancer screening   IMPRESSION:  History of colon polyps.  A polyp was removed in [place] in 2015.  Pathology results are not available but he was told to have another one in 5 years.  He is due surveillance colonoscopy. Ideally, would proceed with 48 hour Xarelto washout prior to colonoscopy if okay with the prescribing doctor given the potential need for polypectomy.  I discussed with the patient that there is a low, but real, risk of a cardiovascular event such as heart attack, stroke, or embolism/thrombosis while off Xarelto.   PLAN: Colonoscopy after a Xarelto washout   HPI: Andres [name] is a 76 y.o. male presents for colon cancer screening.  The history is obtained through the patient review of his electronic health record.  He has a history of coronary artery disease with a prior myocardial infarction, dyslipidemia, atrial fibrillation and flutter treated with ablation and cryoablation and hypertension.  He is on long-term Xarelto as prescribed by his cardiologist.  Echocardiogram showed normal EF and no significant valvular abnormalities.  GI review of systems is negative.  He is active playing basketball and walking.  He had a colonoscopy in [place] in 2015 and a small ascending colon polyp was removed and he had sigmoid diverticulosis. He was told to have another in 5 years.  He tolerated the procedure.   There is no known family history of colon cancer or polyps. No family history of stomach cancer or other GI malignancy. No family history of inflammatory bowel disease or celiac.    Past Medical History:  Diagnosis Date   Coronary artery disease    Hypertension    NSTEMI (non-ST elevated myocardial infarction) ([place]) 3/209    Past Surgical History:  Procedure Laterality Date   APPENDECTOMY     CORONARY STENT INTERVENTION     FOOT SURGERY     HERNIA  REPAIR     KNEE CARTILAGE SURGERY Right    ROTATOR CUFF REPAIR        Current Outpatient Medications  Medication Sig Dispense Refill   amLODipine (NORVASC) 10 MG tablet Take 1 tablet by mouth daily.     aspirin 81 MG chewable tablet Chew 81 mg by mouth daily.     atorvastatin (LIPITOR) 80 MG tablet Take 80 mg by mouth daily.     EPINEPHrine 0.3 mg/0.3 mL IJ SOAJ injection Inject 0.3 mg into the muscle as needed for anaphylaxis. 1 each 1   gabapentin (NEURONTIN) 300 MG capsule Take by mouth.     metoprolol succinate (TOPROL-XL) 25 MG 24 hr tablet Take 25 mg by mouth daily.     Na Sulfate-K Sulfate-Mg Sulf 17.5-3.13-1.6 GM/177ML SOLN Take 1 kit by mouth once for 1 dose. 354 mL 0   nitroGLYCERIN (NITROSTAT) 0.4 MG SL tablet Place 0.4 mg under the tongue every 5 (five) minutes as needed for chest pain.     rivaroxaban (XARELTO) 20 MG TABS tablet Take by mouth.     No current facility-administered medications for this visit.    Allergies as of 02/13/2022 - Review Complete 02/13/2022  Allergen Reaction Noted   Lisinopril Swelling and Other (See Comments) 05/15/2021    Family History  Problem Relation Age of Onset   Stomach cancer Neg Hx    Esophageal cancer Neg Hx    Colon cancer Neg Hx     Social History   Socioeconomic  History   Marital status: Married    Spouse name: Not on file   Number of children: 2   Years of education: Not on file   Highest education level: Not on file  Occupational History   Occupation: retird  Tobacco Use   Smoking status: Former   Smokeless tobacco: Never  Scientific laboratory technician Use: Never used  Substance and Sexual Activity   Alcohol use: Not Currently   Drug use: Never   Sexual activity: Not on file  Other Topics Concern   Not on file  Social History Narrative   Not on file   Social Determinants of Health   Financial Resource Strain: Not on file  Food Insecurity: Not on file  Transportation Needs: Not on file  Physical Activity: Not  on file  Stress: Not on file  Social Connections: Not on file  Intimate Partner Violence: Not on file    Review of Systems: 12 system ROS is negative except as noted above.   Physical Exam: General:   Alert,  well-nourished, pleasant and cooperative in NAD Head:  Normocephalic and atraumatic. Eyes:  Sclera clear, no icterus.   Conjunctiva pink. Ears:  Normal auditory acuity. Nose:  No deformity, discharge,  or lesions. Mouth:  No deformity or lesions.   Neck:  Supple; no masses or thyromegaly. Lungs:  Clear throughout to auscultation.   No wheezes. Heart:  Regular rate and rhythm; no murmurs. Abdomen:  Soft, nontender, nondistended, normal bowel sounds, no rebound or guarding. No hepatosplenomegaly.   Rectal:  Deferred  Msk:  Symmetrical. No boney deformities LAD: No inguinal or umbilical LAD Extremities:  No clubbing or edema. Neurologic:  Alert and  oriented x4;  grossly nonfocal Skin:  Intact without significant lesions or rashes. Psych:  Alert and cooperative. Normal mood and affect.   Walfred Bettendorf L. Tarri Glenn, MD, MPH 02/13/2022, 9:57 AM

## 2022-02-13 NOTE — Patient Instructions (Signed)
It was a pleasure to meet you today.  I recommended a colonoscopy for colon cancer screening.  We will work to coordinate holding your Xarelto prior to the procedure as there is a low, but real, risk of cardiovascular events such as heart attack, stroke, or clot.  We will work with your doctor to minimize those risks.  Tips for colonoscopy:  - Stay well hydrated for 3-4 days prior to the exam. This reduces nausea and dehydration.  - To prevent skin/hemorrhoid irritation - prior to wiping, put A&Dointment or vaseline on the toilet paper. - Keep a towel or pad on the bed.  - Drink  64oz of clear liquids in the morning of prep day (prior to starting the prep) to be sure that there is enough fluid to flush the colon and stay hydrated!!!! This is in addition to the fluids required for preparation. - Use of a flavored hard candy, such as grape Anise Salvo, can counteract some of the flavor of the prep and may prevent some nausea.

## 2022-02-17 ENCOUNTER — Telehealth: Payer: Self-pay | Admitting: *Deleted

## 2022-02-17 NOTE — Telephone Encounter (Signed)
Faxed request to Priscella Mann, NP at [facility]

## 2022-02-20 NOTE — Telephone Encounter (Signed)
Spoke with [facility] they have received request and should fax a response today. Patient informed.

## 2022-02-20 NOTE — Telephone Encounter (Signed)
Patient called would like to follow up on direction on when to stop Xarelto. Please advise.

## 2022-02-23 NOTE — Telephone Encounter (Signed)
Patient informed. 

## 2022-02-23 NOTE — Telephone Encounter (Signed)
Per Priscella Mann, NP patient may hold Xarelto. Left message for patient to call office.

## 2022-02-25 ENCOUNTER — Ambulatory Visit (AMBULATORY_SURGERY_CENTER): Payer: Medicare Other | Admitting: Gastroenterology

## 2022-02-25 ENCOUNTER — Encounter: Payer: Self-pay | Admitting: Gastroenterology

## 2022-02-25 VITALS — BP 99/69 | HR 46 | Temp 97.8°F | Resp 10 | Ht 69.0 in | Wt 174.0 lb

## 2022-02-25 DIAGNOSIS — D122 Benign neoplasm of ascending colon: Secondary | ICD-10-CM | POA: Diagnosis not present

## 2022-02-25 DIAGNOSIS — D128 Benign neoplasm of rectum: Secondary | ICD-10-CM

## 2022-02-25 DIAGNOSIS — Z7902 Long term (current) use of antithrombotics/antiplatelets: Secondary | ICD-10-CM

## 2022-02-25 DIAGNOSIS — Z86010 Personal history of colonic polyps: Secondary | ICD-10-CM

## 2022-02-25 DIAGNOSIS — D124 Benign neoplasm of descending colon: Secondary | ICD-10-CM | POA: Diagnosis not present

## 2022-02-25 DIAGNOSIS — Z09 Encounter for follow-up examination after completed treatment for conditions other than malignant neoplasm: Secondary | ICD-10-CM

## 2022-02-25 MED ORDER — SODIUM CHLORIDE 0.9 % IV SOLN: 500.0000 mL | Freq: Once | INTRAVENOUS | Status: DC

## 2022-02-25 NOTE — Progress Notes (Signed)
Pt resting comfortably. VSS. Airway intact. SBAR complete to RN. All questions answered.   

## 2022-02-25 NOTE — Progress Notes (Signed)
Called to room to assist during endoscopic procedure.  Patient ID and intended procedure confirmed with present staff. Received instructions for my participation in the procedure from the performing physician.  

## 2022-02-25 NOTE — Patient Instructions (Signed)
Discharge instructions given. Handouts on polyps,diverticulosis and hemorrhoids. Resume previous medications. Resume Xarelto 02/28/22. YOU HAD AN ENDOSCOPIC PROCEDURE TODAY AT [facility]:   Refer to the procedure report that was given to you for any specific questions about what was found during the examination.  If the procedure report does not answer your questions, please call your gastroenterologist to clarify.  If you requested that your care partner not be given the details of your procedure findings, then the procedure report has been included in a sealed envelope for you to review at your convenience later.  YOU SHOULD EXPECT: Some feelings of bloating in the abdomen. Passage of more gas than usual.  Walking can help get rid of the air that was put into your GI tract during the procedure and reduce the bloating. If you had a lower endoscopy (such as a colonoscopy or flexible sigmoidoscopy) you may notice spotting of blood in your stool or on the toilet paper. If you underwent a bowel prep for your procedure, you may not have a normal bowel movement for a few days.  Please Note:  You might notice some irritation and congestion in your nose or some drainage.  This is from the oxygen used during your procedure.  There is no need for concern and it should clear up in a day or so.  SYMPTOMS TO REPORT IMMEDIATELY:  Following lower endoscopy (colonoscopy or flexible sigmoidoscopy):  Excessive amounts of blood in the stool  Significant tenderness or worsening of abdominal pains  Swelling of the abdomen that is new, acute  Fever of 100F or higher   For urgent or emergent issues, a gastroenterologist can be reached at any hour by calling [phone]. Do not use MyChart messaging for urgent concerns.    DIET:  We do recommend a small meal at first, but then you may proceed to your regular diet.  Drink plenty of fluids but you should avoid alcoholic beverages for 24  hours.  ACTIVITY:  You should plan to take it easy for the rest of today and you should NOT DRIVE or use heavy machinery until tomorrow (because of the sedation medicines used during the test).    FOLLOW UP: Our staff will call the number listed on your records the next business day following your procedure.  We will call around 7:15- 8:00 am to check on you and address any questions or concerns that you may have regarding the information given to you following your procedure. If we do not reach you, we will leave a message.     If any biopsies were taken you will be contacted by phone or by letter within the next 1-3 weeks.  Please call us at [phone] if you have not heard about the biopsies in 3 weeks.    SIGNATURES/CONFIDENTIALITY: You and/or your care partner have signed paperwork which will be entered into your electronic medical record.  These signatures attest to the fact that that the information above on your After Visit Summary has been reviewed and is understood.  Full responsibility of the confidentiality of this discharge information lies with you and/or your care-partner.

## 2022-02-25 NOTE — Progress Notes (Signed)
Indication for colonoscopy: History of colon polyps  Please see my 02/13/22 office note for complete details. There has been no change in history or physical exam since that time.

## 2022-02-25 NOTE — Op Note (Signed)
[facility] Patient Name: Andres [name] Procedure Date: 02/25/2022 9:53 AM MRN: [id number] Endoscopist: Thornton Park MD, MD, [id number] Age: 76 Referring MD:  Date of Birth: 10/21/1945 Gender: Male Account #: [id number] Procedure:                Colonoscopy Indications:              Surveillance: Personal history of adenomatous                            polyps on last colonoscopy 5 years ago                           Colonoscopy in [place] in 2015 and a small                            ascending colon polyp was removed and he had                            sigmoid diverticulosis. He was told to have another                            colonoscopy in 5 years. Medicines:                Monitored Anesthesia Care Procedure:                Pre-Anesthesia Assessment:                           - Prior to the procedure, a History and Physical                            was performed, and patient medications and                            allergies were reviewed. The patient's tolerance of                            previous anesthesia was also reviewed. The risks                            and benefits of the procedure and the sedation                            options and risks were discussed with the patient.                            All questions were answered, and informed consent                            was obtained. Prior Anticoagulants: The patient has                            taken Xarelto (rivaroxaban), last dose was 2 days  prior to procedure. ASA Grade Assessment: III - A                            patient with severe systemic disease. After                            reviewing the risks and benefits, the patient was                            deemed in satisfactory condition to undergo the                            procedure.                           After obtaining informed consent, the colonoscope                             was passed under direct vision. Throughout the                            procedure, the patient's blood pressure, pulse, and                            oxygen saturations were monitored continuously. The                            CF [id number] #[id number] was introduced through the anus                            and advanced to the 3 cm into the ileum. The                            colonoscopy was performed without difficulty. The                            patient tolerated the procedure well. The quality                            of the bowel preparation was good. The terminal                            ileum, ileocecal valve, appendiceal orifice, and                            rectum were photographed. Scope In: 10:05:22 AM Scope Out: 10:28:05 AM Scope Withdrawal Time: 0 hours 19 minutes 38 seconds  Total Procedure Duration: 0 hours 22 minutes 43 seconds  Findings:                 The perianal and digital rectal examinations were                            normal.  A 2 mm polyp was found in the rectum. The polyp was                            flat. The polyp was removed with a cold snare.                            Resection and retrieval were complete. Estimated                            blood loss was minimal.                           A 4-5 mm polyp was found in the descending colon.                            The polyp was sessile. The polyp was removed with a                            cold snare. Resection and retrieval were complete.                            Estimated blood loss was minimal.                           A 12 mm polyp was found in the ascending colon. The                            polyp was flat. The polyp was removed with a                            piecemeal technique using a cold snare. Resection                            and retrieval were complete. Area was tattooed with                            an injection of 1 mL of [place]  ink.                           Many medium-mouthed and small-mouthed diverticula                            were found in the sigmoid colon and descending                            colon.                           Non-bleeding external and internal hemorrhoids were                            found. Complications:            No immediate complications. Estimated Blood  Loss:     Estimated blood loss was minimal. Impression:               - One 2 mm polyp in the rectum, removed with a cold                            snare. Resected and retrieved.                           - One 4-5 mm polyp in the descending colon, removed                            with a cold snare. Resected and retrieved.                           - One 12 mm polyp in the ascending colon, removed                            piecemeal using a cold snare. Resected and                            retrieved. Tattooed. Recommendation:           - Patient has a contact number available for                            emergencies. The signs and symptoms of potential                            delayed complications were discussed with the                            patient. Return to normal activities tomorrow.                            Written discharge instructions were provided to the                            patient.                           - High fiber diet.                           - Continue present medications.                           - Resume Xarelto 02/28/22.                           - Await pathology results.                           - Repeat colonoscopy date to be determined after                            pending  pathology results are reviewed for                            surveillance.                           - Emerging evidence supports eating a diet of                            fruits, vegetables, grains, calcium, and yogurt                            while reducing red meat and alcohol may reduce  the                            risk of colon cancer.                           - Thank you for allowing me to be involved in your                            colon cancer prevention. Thornton Park MD, MD 02/25/2022 10:38:29 AM This report has been signed electronically.

## 2022-02-26 ENCOUNTER — Telehealth: Payer: Self-pay | Admitting: *Deleted

## 2022-02-26 NOTE — Telephone Encounter (Signed)
  Follow up Call-     02/25/2022    9:22 AM  Call back number  Post procedure Call Back phone  # [phone]  Permission to leave phone message Yes     Patient questions:  Do you have a fever, pain , or abdominal swelling? No. Pain Score  0 *  Have you tolerated food without any problems? Yes.    Have you been able to return to your normal activities? Yes.    Do you have any questions about your discharge instructions: Diet   No. Medications  No. Follow up visit  No.  Do you have questions or concerns about your Care? No.  Actions: * If pain score is 4 or above: No action needed, pain <4.

## 2022-03-05 ENCOUNTER — Encounter: Payer: Self-pay | Admitting: Gastroenterology

## 2022-03-26 ENCOUNTER — Encounter (HOSPITAL_BASED_OUTPATIENT_CLINIC_OR_DEPARTMENT_OTHER): Payer: Self-pay

## 2022-03-26 ENCOUNTER — Emergency Department (HOSPITAL_BASED_OUTPATIENT_CLINIC_OR_DEPARTMENT_OTHER)
Admission: EM | Admit: 2022-03-26 | Discharge: 2022-03-26 | Disposition: A | Discharge status: Home / Self Care | Payer: Medicare Other | Attending: Emergency Medicine | Admitting: Emergency Medicine

## 2022-03-26 ENCOUNTER — Emergency Department (HOSPITAL_BASED_OUTPATIENT_CLINIC_OR_DEPARTMENT_OTHER): Payer: Medicare Other

## 2022-03-26 ENCOUNTER — Other Ambulatory Visit: Payer: Self-pay

## 2022-03-26 DIAGNOSIS — W2105XD Struck by basketball, subsequent encounter: Secondary | ICD-10-CM | POA: Diagnosis not present

## 2022-03-26 DIAGNOSIS — Z7901 Long term (current) use of anticoagulants: Secondary | ICD-10-CM | POA: Diagnosis not present

## 2022-03-26 DIAGNOSIS — Z7982 Long term (current) use of aspirin: Secondary | ICD-10-CM | POA: Diagnosis not present

## 2022-03-26 DIAGNOSIS — R110 Nausea: Secondary | ICD-10-CM | POA: Insufficient documentation

## 2022-03-26 DIAGNOSIS — R519 Headache, unspecified: Secondary | ICD-10-CM | POA: Diagnosis not present

## 2022-03-26 DIAGNOSIS — S0990XA Unspecified injury of head, initial encounter: Secondary | ICD-10-CM

## 2022-03-26 DIAGNOSIS — Y9367 Activity, basketball: Secondary | ICD-10-CM | POA: Diagnosis not present

## 2022-03-26 NOTE — ED Provider Notes (Signed)
Andres [name]   CSN: [id number] Arrival date & time: 03/26/22  1642     History  Chief Complaint  Patient presents with   Head Injury    Andres [name] is a 76 y.o. male patient on Xarelto who presents to the emerged department with a subsequent head injury that occurred yesterday while playing bass call.  Patient states he was going down the court trying to get a basketball when the opponent excellently elbowed him in the side of the head.  Since then he has been having some mild headaches and some nausea today.  He was sent in by his PCP for further evaluation.  Denies any other injury, blurred vision, LOC.Marland Kitchen   Head Injury      Home Medications Prior to Admission medications   Medication Sig Start Date End Date Taking? Authorizing Provider  amLODipine (NORVASC) 10 MG tablet Take 1 tablet by mouth daily. 05/27/21   [provider]  aspirin 81 MG chewable tablet Chew 81 mg by mouth daily. Patient not taking: Reported on 02/25/2022    [provider]  atorvastatin (LIPITOR) 80 MG tablet Take 80 mg by mouth daily.    [provider]  EPINEPHrine 0.3 mg/0.3 mL IJ SOAJ injection Inject 0.3 mg into the muscle as needed for anaphylaxis. Patient not taking: Reported on 02/25/2022 04/17/21   Sherwood Gambler, MD  gabapentin (NEURONTIN) 300 MG capsule Take by mouth. Patient not taking: Reported on 02/25/2022 08/12/20   [provider]  metoprolol succinate (TOPROL-XL) 25 MG 24 hr tablet Take 25 mg by mouth daily. 06/13/21   [provider]  nitroGLYCERIN (NITROSTAT) 0.4 MG SL tablet Place 0.4 mg under the tongue every 5 (five) minutes as needed for chest pain.    [provider]  rivaroxaban (XARELTO) 20 MG TABS tablet Take by mouth. 04/28/21   [provider]      Allergies    Lisinopril    Review of Systems   Review of Systems  All other systems reviewed and are  negative.   Physical Exam Updated Vital Signs BP 121/76   Pulse (!) 57   Temp 97.8 F (36.6 C) (Oral)   Resp 12   Ht '5\' 9"'$  (1.753 m)   Wt 82.6 kg   SpO2 96%   BMI 26.88 kg/m  Physical Exam Vitals and nursing [name] reviewed.  Constitutional:      Appearance: Normal appearance.  HENT:     Head: Normocephalic and atraumatic.  Eyes:     General:        Right eye: No discharge.        Left eye: No discharge.     Conjunctiva/sclera: Conjunctivae normal.  Pulmonary:     Effort: Pulmonary effort is normal.  Skin:    General: Skin is warm and dry.     Findings: No rash.  Neurological:     General: No focal deficit present.     Mental Status: He is alert.     Comments: Cranial nerves II through XII are intact.  5/5 strength to the upper and lower extremities.  Normal sensation to the upper and lower extremities.  No observable nystagmus.  Psychiatric:        Mood and Affect: Mood normal.        Behavior: Behavior normal.     ED Results / Procedures / Treatments   Labs (all labs ordered are listed, but only abnormal results are displayed) Labs Reviewed -  No data to display  EKG None  Radiology CT Head Wo Contrast  Result Date: 03/26/2022 CLINICAL DATA:  chi on xarelto; [facility] on xarelto. Elbowed in head while playing basketball. EXAM: CT HEAD WITHOUT CONTRAST CT CERVICAL SPINE WITHOUT CONTRAST TECHNIQUE: Multidetector CT imaging of the head and cervical spine was performed following the standard protocol without intravenous contrast. Multiplanar CT image reconstructions of the cervical spine were also generated. RADIATION DOSE REDUCTION: This exam was performed according to the departmental dose-optimization program which includes automated exposure control, adjustment of the mA and/or kV according to patient size and/or use of iterative reconstruction technique. COMPARISON:  CT head 03/09/2019. FINDINGS: CT HEAD FINDINGS Brain: No evidence of large-territorial acute infarction.  No parenchymal hemorrhage. No mass lesion. No extra-axial collection. No mass effect or midline shift. No hydrocephalus. Basilar cisterns are patent. Vascular: No hyperdense vessel. Skull: No acute fracture or focal lesion. Sinuses/Orbits: Paranasal sinuses and mastoid air cells are clear. The orbits are unremarkable. Other: None. CT CERVICAL SPINE FINDINGS Alignment: Normal. Skull base and vertebrae: Multilevel moderate degenerative changes spine most prominent at the C1-C2 and C5-C6 levels. No acute fracture. No aggressive appearing focal osseous lesion or focal pathologic process. Soft tissues and spinal canal: No prevertebral fluid or swelling. No visible canal hematoma. Upper chest: Unremarkable. Other: None. IMPRESSION: 1.  No acute intracranial abnormality. 2. No acute displaced fracture or traumatic listhesis of the cervical spine. Electronically Signed   By: Iven Finn M.D.   On: 03/26/2022 18:30   CT Cervical Spine Wo Contrast  Result Date: 03/26/2022 CLINICAL DATA:  chi on xarelto; [facility]I on xarelto. Elbowed in head while playing basketball. EXAM: CT HEAD WITHOUT CONTRAST CT CERVICAL SPINE WITHOUT CONTRAST TECHNIQUE: Multidetector CT imaging of the head and cervical spine was performed following the standard protocol without intravenous contrast. Multiplanar CT image reconstructions of the cervical spine were also generated. RADIATION DOSE REDUCTION: This exam was performed according to the departmental dose-optimization program which includes automated exposure control, adjustment of the mA and/or kV according to patient size and/or use of iterative reconstruction technique. COMPARISON:  CT head 03/09/2019. FINDINGS: CT HEAD FINDINGS Brain: No evidence of large-territorial acute infarction. No parenchymal hemorrhage. No mass lesion. No extra-axial collection. No mass effect or midline shift. No hydrocephalus. Basilar cisterns are patent. Vascular: No hyperdense vessel. Skull: No acute fracture or  focal lesion. Sinuses/Orbits: Paranasal sinuses and mastoid air cells are clear. The orbits are unremarkable. Other: None. CT CERVICAL SPINE FINDINGS Alignment: Normal. Skull base and vertebrae: Multilevel moderate degenerative changes spine most prominent at the C1-C2 and C5-C6 levels. No acute fracture. No aggressive appearing focal osseous lesion or focal pathologic process. Soft tissues and spinal canal: No prevertebral fluid or swelling. No visible canal hematoma. Upper chest: Unremarkable. Other: None. IMPRESSION: 1.  No acute intracranial abnormality. 2. No acute displaced fracture or traumatic listhesis of the cervical spine. Electronically Signed   By: Iven Finn M.D.   On: 03/26/2022 18:30    Procedures Procedures    Medications Ordered in ED Medications - No data to display  ED Course/ Medical Decision Making/ A&P                           Medical Decision Making Andres [name] is a 76 y.o. male patient who presents to the emergency department today for further evaluation of a subsequent head injury that occurred yesterday.  Patient's neurological exam is completely normal.  Have  a low suspicion for any intracranial hemorrhage or subsequent stroke.  However, given patient is on Xarelto, I will likely get a CT image of the head and neck.  Imaging of the head neck is normal.  I personally ordered and interpreted the studies.  Likely mild traumatic brain injury or concussion.  I have educated patient on symptom control.  Strict return precautions were discussed.  He will follow-up with his primary care doctor.  He is safe for discharge.   Amount and/or Complexity of Data Reviewed Radiology: ordered.    Final Clinical Impression(s) / ED Diagnoses Final diagnoses:  Injury of head, initial encounter    Rx / DC Orders ED Discharge Orders     None         Cherrie Gauze 03/26/22 1928    Elgie Congo, MD 03/27/22 [phone]

## 2022-03-26 NOTE — Discharge Instructions (Signed)
Please exercise as tolerated.  Please follow-up with your primary care doctor for further evaluation.  If you are having headaches, blurred vision, fatigue, I would rest.  Please return to the emergency room if any worsening symptoms.

## 2022-03-26 NOTE — ED Triage Notes (Signed)
Pt presents to ED with c/o being elbowed in head while playing basketball yesterday. Denies LOC. Today feels nauseated and was told by MD to come in for evaluation. Denies dizziness at this time. Pt is on Zeralto.

## 2022-03-26 NOTE — ED Notes (Signed)
RN provided AVS using Teachback Method. Patient verbalizes understanding of Discharge Instructions. Opportunity for Questioning and Answers were provided by RN. Patient Discharged from ED ambulatory to Home via Self.  

## 2022-11-07 ENCOUNTER — Emergency Department (HOSPITAL_BASED_OUTPATIENT_CLINIC_OR_DEPARTMENT_OTHER)
Admission: EM | Admit: 2022-11-07 | Discharge: 2022-11-07 | Disposition: A | Discharge status: Home / Self Care | Payer: Medicare Other | Attending: Emergency Medicine | Admitting: Emergency Medicine

## 2022-11-07 ENCOUNTER — Encounter (HOSPITAL_BASED_OUTPATIENT_CLINIC_OR_DEPARTMENT_OTHER): Payer: Self-pay | Admitting: Emergency Medicine

## 2022-11-07 ENCOUNTER — Other Ambulatory Visit (HOSPITAL_BASED_OUTPATIENT_CLINIC_OR_DEPARTMENT_OTHER): Payer: Self-pay

## 2022-11-07 ENCOUNTER — Emergency Department (HOSPITAL_BASED_OUTPATIENT_CLINIC_OR_DEPARTMENT_OTHER): Payer: Medicare Other | Admitting: Radiology

## 2022-11-07 ENCOUNTER — Other Ambulatory Visit: Payer: Self-pay

## 2022-11-07 DIAGNOSIS — R748 Abnormal levels of other serum enzymes: Secondary | ICD-10-CM | POA: Diagnosis not present

## 2022-11-07 DIAGNOSIS — Z7982 Long term (current) use of aspirin: Secondary | ICD-10-CM | POA: Insufficient documentation

## 2022-11-07 DIAGNOSIS — Z7901 Long term (current) use of anticoagulants: Secondary | ICD-10-CM | POA: Insufficient documentation

## 2022-11-07 DIAGNOSIS — R42 Dizziness and giddiness: Secondary | ICD-10-CM

## 2022-11-07 DIAGNOSIS — I2510 Atherosclerotic heart disease of native coronary artery without angina pectoris: Secondary | ICD-10-CM | POA: Insufficient documentation

## 2022-11-07 DIAGNOSIS — R110 Nausea: Secondary | ICD-10-CM

## 2022-11-07 DIAGNOSIS — Z79899 Other long term (current) drug therapy: Secondary | ICD-10-CM | POA: Insufficient documentation

## 2022-11-07 DIAGNOSIS — I10 Essential (primary) hypertension: Secondary | ICD-10-CM | POA: Diagnosis not present

## 2022-11-07 LAB — COMPREHENSIVE METABOLIC PANEL
ALT: 20 U/L (ref 0–44)
AST: 21 U/L (ref 15–41)
Albumin: 4.4 g/dL (ref 3.5–5.0)
Alkaline Phosphatase: 68 U/L (ref 38–126)
Anion gap: 7 (ref 5–15)
BUN: 12 mg/dL (ref 8–23)
CO2: 28 mmol/L (ref 22–32)
Calcium: 9.1 mg/dL (ref 8.9–10.3)
Chloride: 105 mmol/L (ref 98–111)
Creatinine, Ser: 0.92 mg/dL (ref 0.61–1.24)
GFR, Estimated: >60 mL/min (ref >60)
Glucose, Bld: 93 mg/dL (ref 70–99)
Potassium: 3.7 mmol/L (ref 3.5–5.1)
Sodium: 140 mmol/L (ref 135–145)
Total Bilirubin: 0.8 mg/dL (ref 0.3–1.2)
Total Protein: 7 g/dL (ref 6.5–8.1)

## 2022-11-07 LAB — URINALYSIS, ROUTINE W REFLEX MICROSCOPIC
Bilirubin Urine: NEGATIVE
Glucose, UA: NEGATIVE mg/dL
Hgb urine dipstick: NEGATIVE
Ketones, ur: NEGATIVE mg/dL
Leukocytes,Ua: NEGATIVE
Nitrite: NEGATIVE
Protein, ur: NEGATIVE mg/dL
Specific Gravity, Urine: 1.008 (ref 1.005–1.030)
pH: 6 (ref 5.0–8.0)

## 2022-11-07 LAB — CBC WITH DIFFERENTIAL/PLATELET
Abs Immature Granulocytes: 0.01 10*3/uL (ref 0.00–0.07)
Basophils Absolute: 0.1 10*3/uL (ref 0.0–0.1)
Basophils Relative: 1 %
Eosinophils Absolute: 0.2 10*3/uL (ref 0.0–0.5)
Eosinophils Relative: 3 %
HCT: 44.8 % (ref 39.0–52.0)
Hemoglobin: 15.1 g/dL (ref 13.0–17.0)
Immature Granulocytes: 0 %
Lymphocytes Relative: 19 %
Lymphs Abs: 1.2 10*3/uL (ref 0.7–4.0)
MCH: 29.6 pg (ref 26.0–34.0)
MCHC: 33.7 g/dL (ref 30.0–36.0)
MCV: 87.8 fL (ref 80.0–100.0)
Monocytes Absolute: 0.5 10*3/uL (ref 0.1–1.0)
Monocytes Relative: 8 %
Neutro Abs: 4.2 10*3/uL (ref 1.7–7.7)
Neutrophils Relative %: 69 %
Platelets: 231 10*3/uL (ref 150–400)
RBC: 5.1 MIL/uL (ref 4.22–5.81)
RDW: 13.8 % (ref 11.5–15.5)
WBC: 6 10*3/uL (ref 4.0–10.5)
nRBC: 0 % (ref 0.0–0.2)

## 2022-11-07 LAB — TROPONIN I (HIGH SENSITIVITY)
Troponin I (High Sensitivity): 6 ng/L (ref <18)
Troponin I (High Sensitivity): 5 ng/L (ref <18)

## 2022-11-07 LAB — LIPASE, BLOOD: Lipase: 142 U/L — ABNORMAL HIGH (ref 11–51)

## 2022-11-07 MED ORDER — ONDANSETRON 4 MG PO TBDP
4.0000 mg | ORAL_TABLET | Freq: Three times a day (TID) | ORAL | 0 refills | Status: AC | PRN
  Filled 2022-11-07: qty 12, 4d supply, fill #0

## 2022-11-07 MED ORDER — SODIUM CHLORIDE 0.9 % IV BOLUS
1000.0000 mL | Freq: Once | INTRAVENOUS | Status: AC
  Administered 2022-11-07: 1000 mL via INTRAVENOUS

## 2022-11-07 NOTE — Discharge Instructions (Addendum)
It was a pleasure taking care of you today.  As discussed, your labs are reassuring.  Your lipase was elevated which is sometimes indicative of pancreatitis.  You were offered a CT scan which you declined.  Please follow-up with PCP early next week for a recheck.  Your symptoms could also be related to dehydration.  Continue drinking extra fluids.  Return to the ER for new or worsening symptoms.

## 2022-11-07 NOTE — ED Triage Notes (Signed)
Pt  via pov from home with nausea and "feeling icky" since Monday. Pt reports that he was told that if he had continuing nausea he should come to the hospital. He reports that he played basketball on Monday and his HR got up to 160; went back down but he hasn't felt well since then. 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 Pt alert  & oriented; nad noted.

## 2022-11-07 NOTE — ED Notes (Signed)
Patient denies dizziness, lightheadedness during the obtainment of orthostatic vitals.

## 2022-11-07 NOTE — ED Provider Notes (Signed)
[place] EMERGENCY DEPARTMENT AT [facility] Provider Note   CSN: [id number] Arrival date & time: 11/07/22  0901     History  Chief Complaint  Patient presents with   Nausea    Andres [name] is a 76 y.o. male with a past medical history significant for CAD, hypertension, A-fib status post ablation on chronic Xarelto who presents to the ED due to nausea and dizziness that have been present for the past 4 to 5 days.  Patient states symptoms started after running on the basketball court where his heart rate reached 160.  Heart rate quickly improved after walking however, he notes he has felt unwell ever since.  Denies associated chest pain and shortness of breath.  No lower extremity edema.  Patient also notes for the past 2 days he has had intermittent episodes of dizziness which he describes as a room spinning sensation.  Dizziness typically occurs when going from sitting to standing or while turning his head.  No previous history of vertigo or dizziness.  Denies speech and visual changes.  No unilateral weakness.  Patient also states his wife has been out of town for 3 weeks and he has been eating poorly with increase in sodium intake.  Also notes he may not have been drinking as much water as he normally does.  No vomiting or diarrhea.  No abdominal pain.  No urinary symptoms.  No fever or chills.  History obtained from patient and past medical records. No interpreter used during encounter.       Home Medications Prior to Admission medications   Medication Sig Start Date End Date Taking? Authorizing Provider  ondansetron (ZOFRAN-ODT) 4 MG disintegrating tablet Dissolve 1 tablet under the tongue every 8 (eight) hours as needed for nausea or vomiting. 11/07/22  Yes Brandy Kabat C, PA-C  amLODipine (NORVASC) 10 MG tablet Take 1 tablet by mouth daily. 05/27/21   [provider]  aspirin 81 MG chewable tablet Chew 81 mg by mouth daily. Patient not taking: Reported  on 02/25/2022    [provider]  atorvastatin (LIPITOR) 80 MG tablet Take 80 mg by mouth daily.    [provider]  EPINEPHrine 0.3 mg/0.3 mL IJ SOAJ injection Inject 0.3 mg into the muscle as needed for anaphylaxis. Patient not taking: Reported on 02/25/2022 04/17/21   Pricilla Loveless, MD  gabapentin (NEURONTIN) 300 MG capsule Take by mouth. Patient not taking: Reported on 02/25/2022 08/12/20   [provider]  metoprolol succinate (TOPROL-XL) 25 MG 24 hr tablet Take 25 mg by mouth daily. 06/13/21   [provider]  nitroGLYCERIN (NITROSTAT) 0.4 MG SL tablet Place 0.4 mg under the tongue every 5 (five) minutes as needed for chest pain.    [provider]  rivaroxaban (XARELTO) 20 MG TABS tablet Take by mouth. 04/28/21   [provider]      Allergies    Lisinopril    Review of Systems   Review of Systems  Constitutional:  Negative for chills and fever.  Eyes:  Negative for visual disturbance.  Respiratory:  Negative for shortness of breath.   Cardiovascular:  Negative for chest pain.  Gastrointestinal:  Positive for nausea. Negative for abdominal pain, diarrhea and vomiting.  Neurological:  Positive for dizziness. Negative for speech difficulty, weakness, numbness and headaches.    Physical Exam Updated Vital Signs BP (!) 154/92 (BP Location: Right Arm)   Pulse 62   Temp 97.8 F (36.6 C) (Oral)   Resp 14  Ht 5\' 9"  (1.753 m)   Wt 86.2 kg   SpO2 100%   BMI 28.06 kg/m  Physical Exam Vitals and nursing note reviewed.  Constitutional:      General: He is not in acute distress.    Appearance: He is not ill-appearing.  HENT:     Head: Normocephalic.  Eyes:     Pupils: Pupils are equal, round, and reactive to light.  Cardiovascular:     Rate and Rhythm: Normal rate and regular rhythm.     Pulses: Normal pulses.     Heart sounds: Normal heart sounds. No murmur heard.    No friction rub. No gallop.  Pulmonary:     Effort:  Pulmonary effort is normal.     Breath sounds: Normal breath sounds.  Abdominal:     General: Abdomen is flat. There is no distension.     Palpations: Abdomen is soft.     Tenderness: There is no abdominal tenderness. There is no guarding or rebound.     Comments: Abdomen soft, nondistended, nontender to palpation in all quadrants without guarding or peritoneal signs. No rebound.   Musculoskeletal:        General: Normal range of motion.     Cervical back: Neck supple.  Skin:    General: Skin is warm and dry.  Neurological:     General: No focal deficit present.     Mental Status: He is alert.     Comments: Speech is clear, able to follow commands CN III-XII intact Normal strength in upper and lower extremities bilaterally including dorsiflexion and plantar flexion, strong and equal grip strength Sensation grossly intact throughout Moves extremities without ataxia, coordination intact No pronator drift  Psychiatric:        Mood and Affect: Mood normal.        Behavior: Behavior normal.     ED Results / Procedures / Treatments   Labs (all labs ordered are listed, but only abnormal results are displayed) Labs Reviewed  LIPASE, BLOOD - Abnormal; Notable for the following components:      Result Value   Lipase 142 (*)    All other components within normal limits  CBC WITH DIFFERENTIAL/PLATELET  COMPREHENSIVE METABOLIC PANEL  URINALYSIS, ROUTINE W REFLEX MICROSCOPIC  TROPONIN I (HIGH SENSITIVITY)  TROPONIN I (HIGH SENSITIVITY)    EKG EKG Interpretation Date/Time:  Saturday November 07 2022 09:17:19 EDT Ventricular Rate:  61 PR Interval:  139 QRS Duration:  110 QT Interval:  458 QTC Calculation: 462 R Axis:   39  Text Interpretation: Sinus rhythm RSR' in V1 or V2, right VCD or RVH Borderline T abnormalities, anterior leads Baseline wander in lead(s) V6 Confirmed by Edwin Dada ([id number]) on 11/07/2022 12:20:33 PM  Radiology DG Chest 2 View  Result Date: 11/07/2022 CLINICAL  DATA:  77 year old male with history of nausea. EXAM: CHEST - 2 VIEW COMPARISON:  Chest x-ray 09/23/2020. FINDINGS: Lung volumes are normal. No consolidative airspace disease. No pleural effusions. No pneumothorax. No pulmonary nodule or mass noted. Pulmonary vasculature and the cardiomediastinal silhouette are within normal limits. Atherosclerosis in the thoracic aorta. Status post right shoulder arthroplasty. IMPRESSION: 1.  No radiographic evidence of acute cardiopulmonary disease. 2. Aortic atherosclerosis. Electronically Signed   By: Trudie Reed M.D.   On: 11/07/2022 10:26    Procedures Procedures    Medications Ordered in ED Medications  sodium chloride 0.9 % bolus 1,000 mL (0 mLs Intravenous Stopped 11/07/22 1142)    ED Course/ Medical Decision  Making/ A&P                             Medical Decision Making Amount and/or Complexity of Data Reviewed Labs: ordered. Decision-making details documented in ED Course. Radiology: ordered and independent interpretation performed. Decision-making details documented in ED Course. ECG/medicine tests: ordered and independent interpretation performed. Decision-making details documented in ED Course.   This patient presents to the ED for concern of dizziness/nausea, this involves an extensive number of treatment options, and is a complaint that carries with it a high risk of complications and morbidity.  The differential diagnosis includes dehydration, atypical ACS, CVA, pancreatitis, etc  77 year old male presents to the ED due to nausea and dizziness that has been persistent ever since running on a basketball court where his heart rate reached 160.  He notes ever since then he has been feeling unwell.  Dizziness typically occurs when going from sitting to standing or while turning his head.  Previous NSTEMI.  History of A-fib status post ablation on chronic Xarelto which he has been compliant with.  No chest pain, shortness of breath, or  abdominal pain.  No vomiting or diarrhea.  No infectious symptoms.  Upon arrival, stable vitals.  Patient is afebrile, not tachycardic or hypoxic.  Patient in no acute distress. Reassuring physical exam.  Abdomen soft, nondistended, nontender.  Normal neurological exam without any neurological deficits. Low suspicion for CVA. Routine labs ordered.  Cardiac labs to rule out atypical ACS.  Will check orthostatic vitals.  IV fluids given.  CBC unremarkable.  No leukocytosis.  Normal hemoglobin.  CMP unremarkable.  Normal renal function.  No major electrolyte derangements.  Lipase elevated at 142.  No epigastric or right upper quadrant tenderness. Low suspicion for acute cholecystitis.  Shared decision making in regards to CT abdomen to rule out evidence of pancreatitis or gallbladder etiology however, patient declined which I find to be reasonable given he has no tenderness on exam.  UA negative for signs of infection.  Troponin normal.  EKG demonstrates normal sinus rhythm.  Nonspecific T wave abnormalities.  Low suspicion for atypical ACS.  Chest x-ray personally reviewed and interpreted which is negative for any acute abnormalities.  Patient was mildly orthostatic prior to IV fluids.  Upon reassessment, patient notes he feels much improved after the IV fluids.  Patient able to ambulate in the ED without difficulty.  No evidence of dizziness. Dizziness likely secondary to dehydration from decreased water intake. Patient states he is ready to be discharged.  Advised patient to follow-up with PCP early next week for recheck. Strict ED precautions discussed with patient. Patient states understanding and agrees to plan. Patient discharged home in no acute distress and stable vitals  Discussed with Dr. Wallace Cullens who agrees with assessment and plan.  Lives at home Has PCP       Final Clinical Impression(s) / ED Diagnoses Final diagnoses:  Dizziness  Nausea    Rx / DC Orders ED Discharge Orders           Ordered    ondansetron (ZOFRAN-ODT) 4 MG disintegrating tablet  Every 8 hours PRN        11/07/22 1219              Mannie Stabile, PA-C 11/07/22 1222    Franne Forts, DO 11/16/22 2324

## 2022-11-30 ENCOUNTER — Other Ambulatory Visit (HOSPITAL_BASED_OUTPATIENT_CLINIC_OR_DEPARTMENT_OTHER): Payer: Self-pay

## 2022-12-31 ENCOUNTER — Other Ambulatory Visit (HOSPITAL_BASED_OUTPATIENT_CLINIC_OR_DEPARTMENT_OTHER): Payer: Self-pay | Admitting: Family Medicine

## 2022-12-31 DIAGNOSIS — R519 Headache, unspecified: Secondary | ICD-10-CM

## 2023-01-08 ENCOUNTER — Ambulatory Visit (HOSPITAL_BASED_OUTPATIENT_CLINIC_OR_DEPARTMENT_OTHER): Payer: Medicare Other

## 2023-01-14 ENCOUNTER — Ambulatory Visit (HOSPITAL_BASED_OUTPATIENT_CLINIC_OR_DEPARTMENT_OTHER)
Admission: RE | Admit: 2023-01-14 | Discharge: 2023-01-14 | Disposition: A | Discharge status: Home / Self Care | Payer: Medicare Other | Source: Ambulatory Visit | Attending: Family Medicine | Admitting: Family Medicine

## 2023-01-14 DIAGNOSIS — R519 Headache, unspecified: Secondary | ICD-10-CM | POA: Diagnosis present

## 2023-01-14 MED ORDER — GADOBUTROL 1 MMOL/ML IV SOLN
8.6000 mL | Freq: Once | INTRAVENOUS | Status: AC | PRN
  Administered 2023-01-14: 8.6 mL via INTRAVENOUS
  Filled 2023-01-14: qty 10

## 2023-03-10 IMAGING — CR DG RIBS W/ CHEST 3+V*R*
4 series · 4 of 4 positions shown · non-contrast
Comparison: [REDACTED] [id number]

CLINICAL DATA: Pain

EXAM:
RIGHT RIBS AND CHEST - 3+ VIEW

[w chest pa]
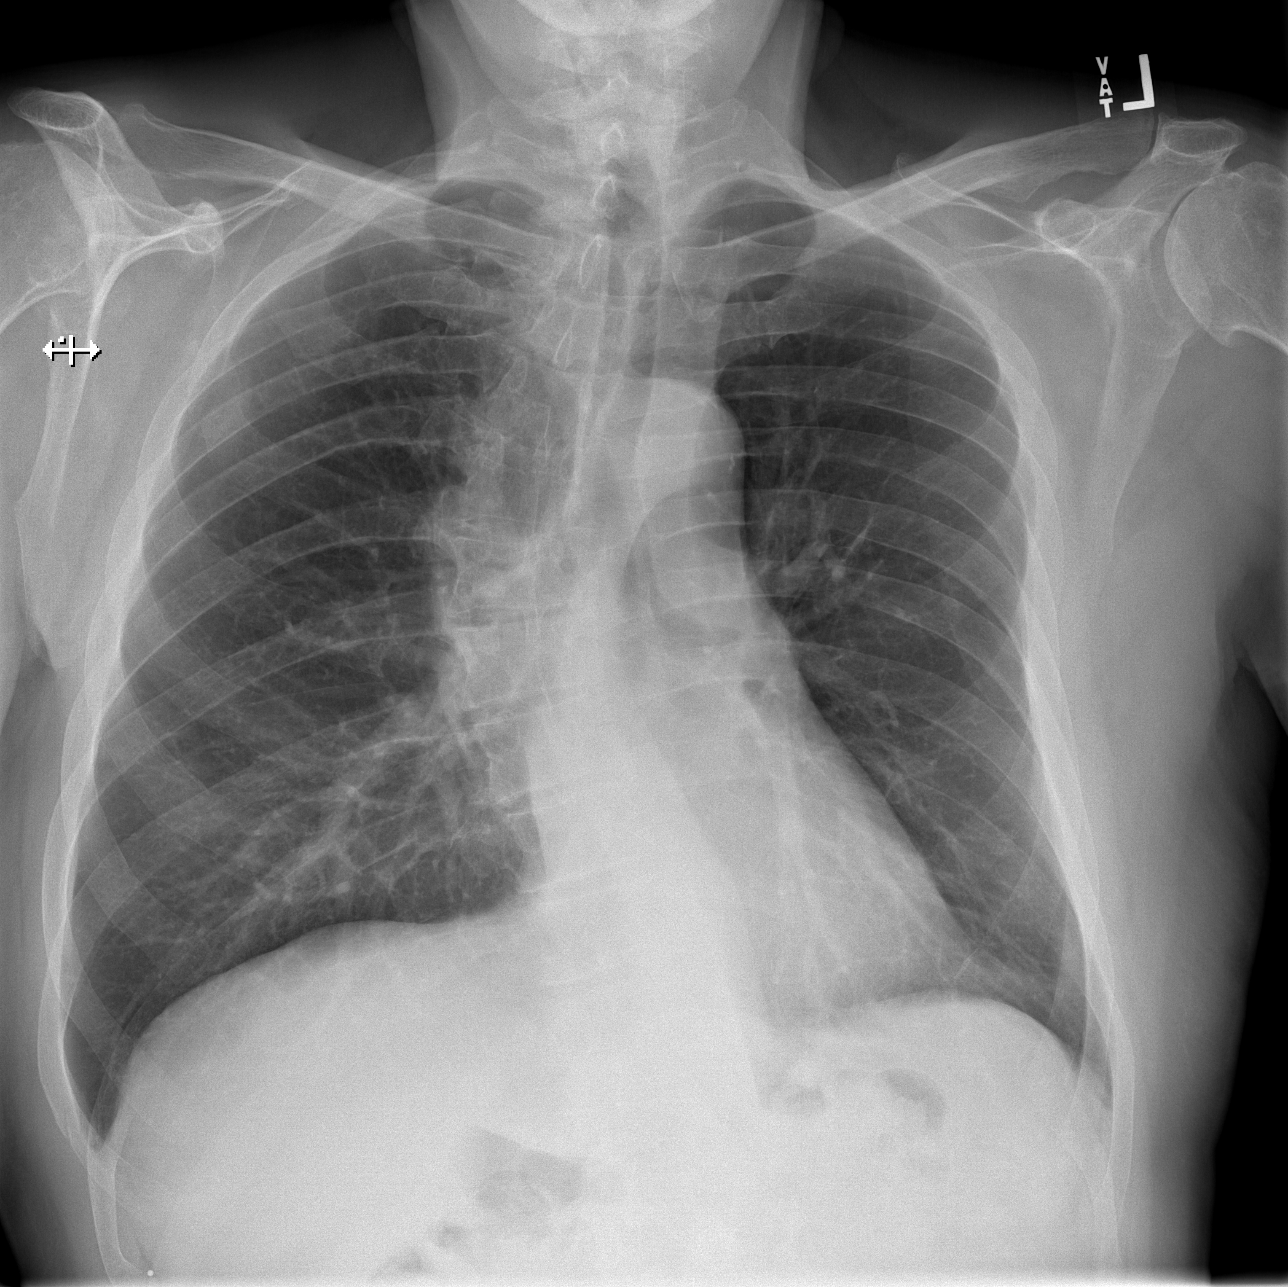

[w ribs ap upper right]
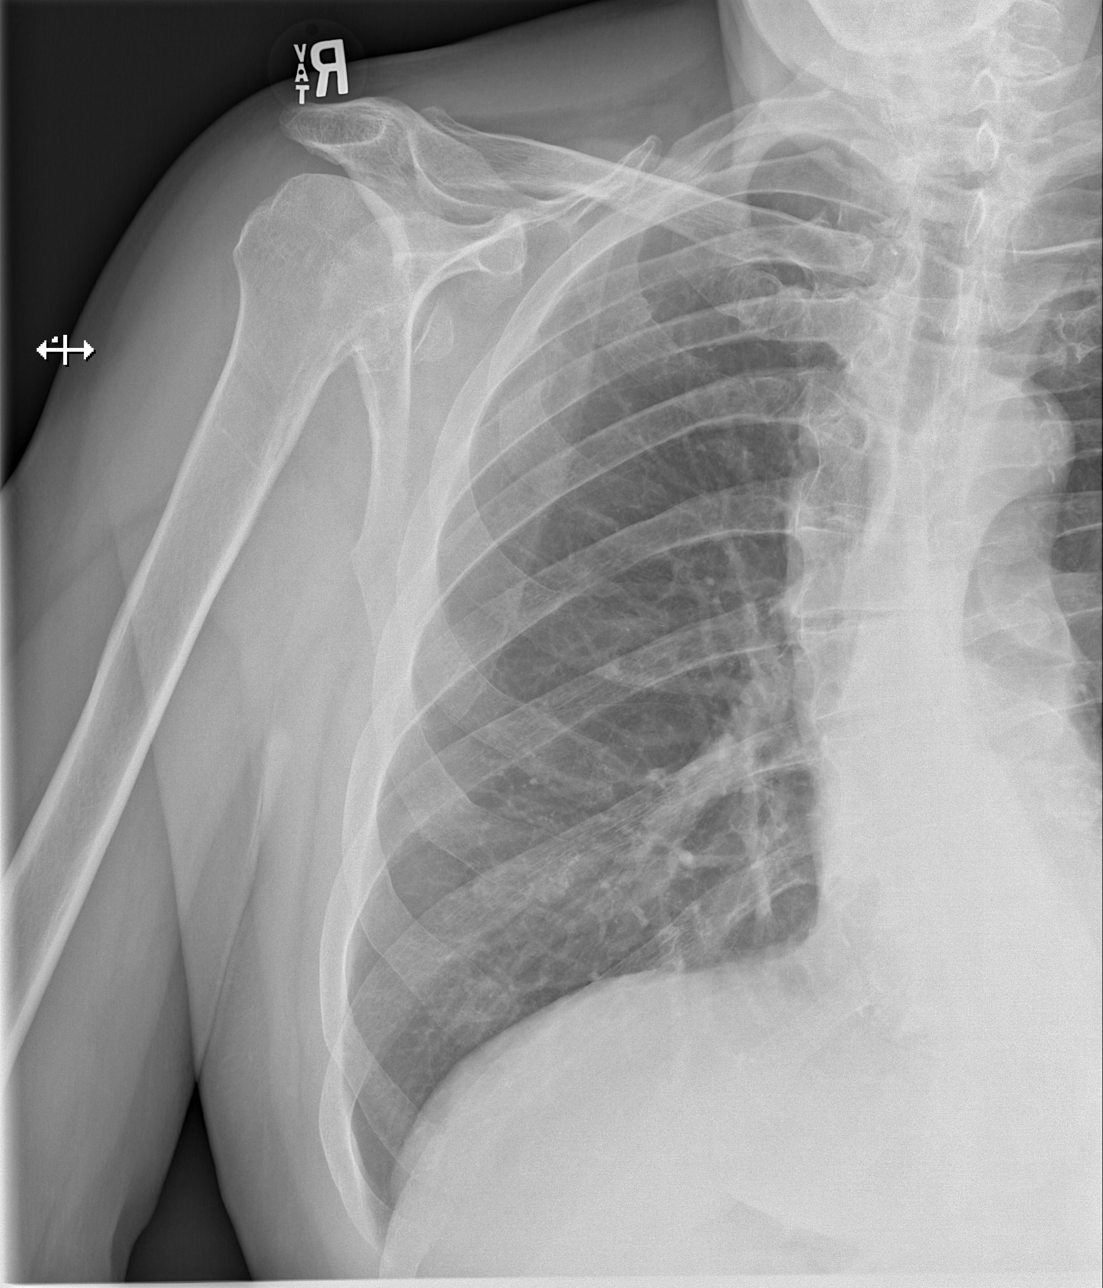

[w ribs ap lower right]
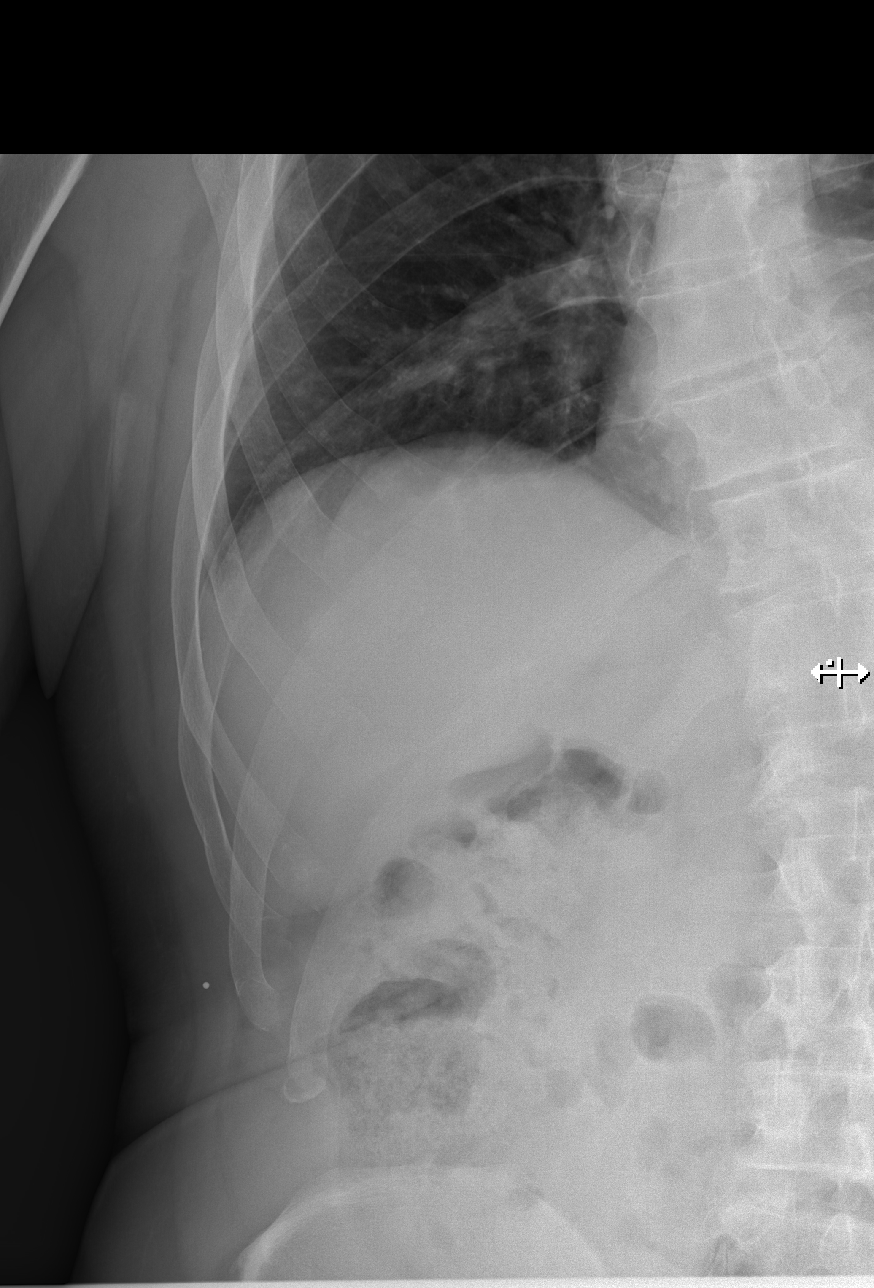

[w ribs obl right]
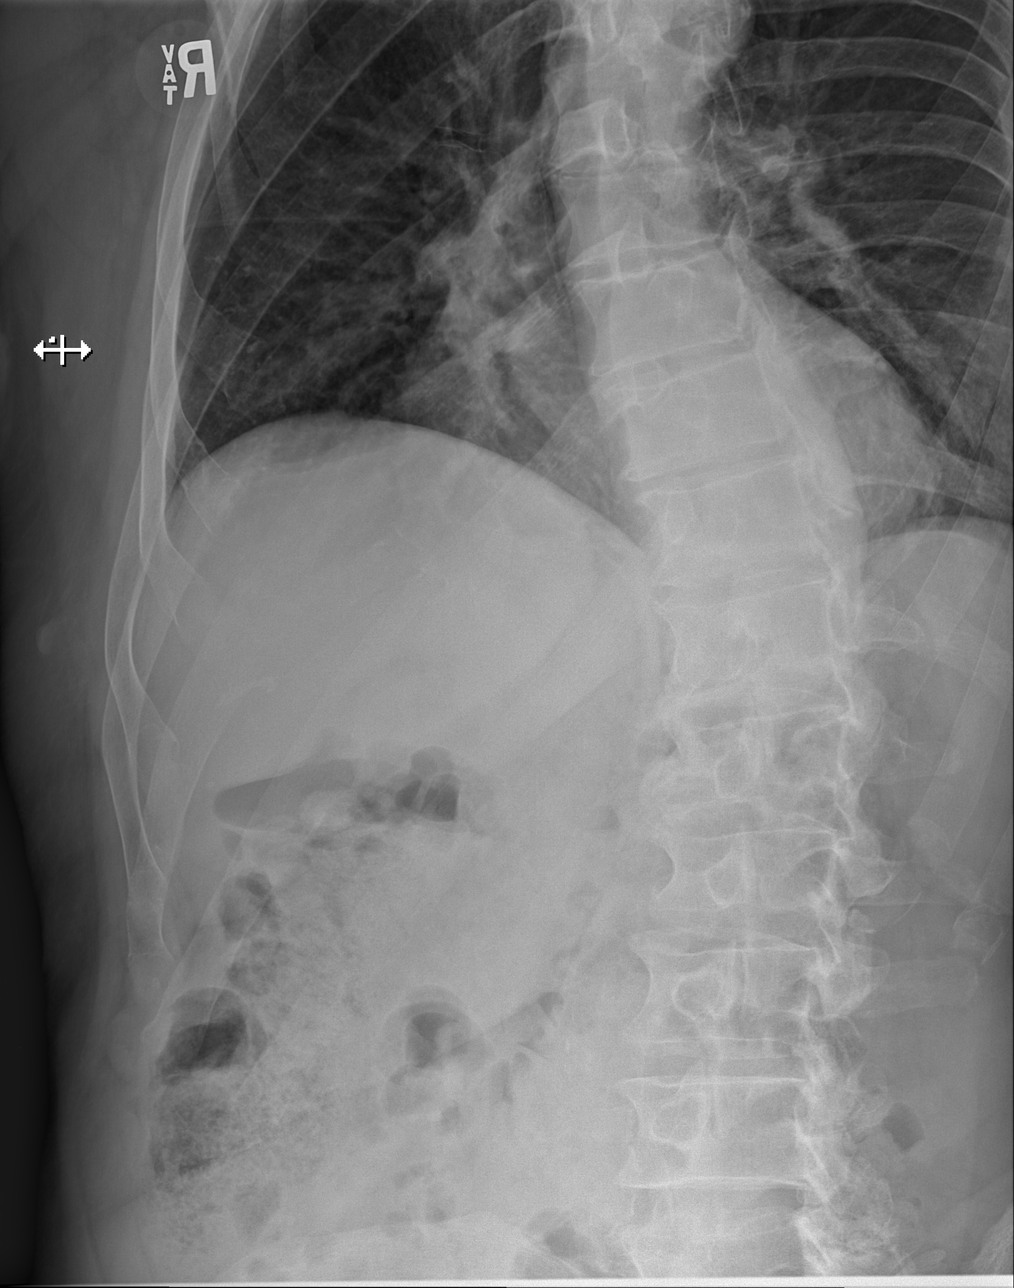

[4 of 4 positions shown; findings below may reference images not displayed]

FINDINGS: Unchanged cardiomediastinal silhouette. Atherosclerotic
calcifications. Tortuous thoracic aorta. No pleural effusion or
pneumothorax. There are well-healed contour deformities of the RIGHT
upper second and third ribs laterally consistent with sequela of
remote prior fracture. There is subtle concavity of the RIGHT
lateral ninth rib consistent with age-indeterminate fracture. BB
overlies the RIGHT lateral tenth rib. Age-indeterminate wedging of
L1 and L2. Dextrocurvature of the thoracic spine with levocurvature
of the lumbar spine.
IMPRESSION: 1. Subtle concavity of the RIGHT lateral ninth rib is consistent
with age-indeterminate fracture. Recommend correlation with point
tenderness.
2. No displaced fracture subjacent to the BB which marks the site of
palpable/painful concern.
3. No pneumothorax.
4. Age-indeterminate wedging of L1 and 2, favored chronic. Recommend
correlation with point tenderness.

## 2023-05-10 ENCOUNTER — Other Ambulatory Visit: Payer: Self-pay | Admitting: Family Medicine

## 2023-05-10 ENCOUNTER — Other Ambulatory Visit (HOSPITAL_BASED_OUTPATIENT_CLINIC_OR_DEPARTMENT_OTHER): Payer: Self-pay | Admitting: Family Medicine

## 2023-05-10 DIAGNOSIS — S8992XA Unspecified injury of left lower leg, initial encounter: Secondary | ICD-10-CM

## 2023-05-14 ENCOUNTER — Ambulatory Visit (HOSPITAL_BASED_OUTPATIENT_CLINIC_OR_DEPARTMENT_OTHER)
Admission: RE | Admit: 2023-05-14 | Discharge: 2023-05-14 | Disposition: A | Discharge status: Home / Self Care | Payer: Medicare Other | Source: Ambulatory Visit | Attending: Family Medicine | Admitting: Family Medicine

## 2023-05-14 DIAGNOSIS — M1712 Unilateral primary osteoarthritis, left knee: Secondary | ICD-10-CM | POA: Diagnosis not present

## 2023-05-14 DIAGNOSIS — M25562 Pain in left knee: Secondary | ICD-10-CM | POA: Diagnosis present

## 2023-05-14 DIAGNOSIS — S83272A Complex tear of lateral meniscus, current injury, left knee, initial encounter: Secondary | ICD-10-CM | POA: Diagnosis not present

## 2023-05-14 DIAGNOSIS — S83242A Other tear of medial meniscus, current injury, left knee, initial encounter: Secondary | ICD-10-CM | POA: Diagnosis not present

## 2023-05-14 DIAGNOSIS — S8992XA Unspecified injury of left lower leg, initial encounter: Secondary | ICD-10-CM

## 2023-05-14 DIAGNOSIS — X58XXXA Exposure to other specified factors, initial encounter: Secondary | ICD-10-CM | POA: Diagnosis not present

## 2023-06-14 ENCOUNTER — Emergency Department (HOSPITAL_BASED_OUTPATIENT_CLINIC_OR_DEPARTMENT_OTHER): Payer: Medicare Other | Admitting: Radiology

## 2023-06-14 ENCOUNTER — Other Ambulatory Visit: Payer: Self-pay

## 2023-06-14 ENCOUNTER — Emergency Department (HOSPITAL_BASED_OUTPATIENT_CLINIC_OR_DEPARTMENT_OTHER)
Admission: EM | Admit: 2023-06-14 | Discharge: 2023-06-14 | Disposition: A | Discharge status: Home / Self Care | Payer: Medicare Other | Attending: Emergency Medicine | Admitting: Emergency Medicine

## 2023-06-14 ENCOUNTER — Encounter (HOSPITAL_BASED_OUTPATIENT_CLINIC_OR_DEPARTMENT_OTHER): Payer: Self-pay | Admitting: *Deleted

## 2023-06-14 DIAGNOSIS — J069 Acute upper respiratory infection, unspecified: Secondary | ICD-10-CM | POA: Insufficient documentation

## 2023-06-14 DIAGNOSIS — I2510 Atherosclerotic heart disease of native coronary artery without angina pectoris: Secondary | ICD-10-CM | POA: Insufficient documentation

## 2023-06-14 DIAGNOSIS — R059 Cough, unspecified: Secondary | ICD-10-CM | POA: Diagnosis present

## 2023-06-14 DIAGNOSIS — Z7901 Long term (current) use of anticoagulants: Secondary | ICD-10-CM | POA: Insufficient documentation

## 2023-06-14 DIAGNOSIS — I10 Essential (primary) hypertension: Secondary | ICD-10-CM | POA: Diagnosis not present

## 2023-06-14 DIAGNOSIS — Z7982 Long term (current) use of aspirin: Secondary | ICD-10-CM | POA: Diagnosis not present

## 2023-06-14 DIAGNOSIS — Z79899 Other long term (current) drug therapy: Secondary | ICD-10-CM | POA: Diagnosis not present

## 2023-06-14 LAB — RESP PANEL BY RT-PCR (RSV, FLU A&B, COVID)  RVPGX2
Influenza A by PCR: NEGATIVE
Influenza B by PCR: NEGATIVE
Resp Syncytial Virus by PCR: NEGATIVE
SARS Coronavirus 2 by RT PCR: NEGATIVE

## 2023-06-14 MED ORDER — BENZONATATE 100 MG PO CAPS: 100.0000 mg | ORAL_CAPSULE | Freq: Three times a day (TID) | ORAL | 0 refills | Status: AC

## 2023-06-14 NOTE — ED Provider Notes (Signed)
 [place] EMERGENCY DEPARTMENT AT [facility] Provider Note   CSN: [id number] Arrival date & time: 06/14/23  1623     History  Chief Complaint  Patient presents with   URI    Andres [name] is a 78 y.o. male.  Patient is a 78 year old male with a history of CAD, hypertension and NSTEMI who presents today with a 5-day history of URI symptoms.  He has had cough, congestion, sore throat since last Thursday which has been persistent but was really bad over the weekend.  He denies any shortness of breath, leg swelling and denies using inhalers at home.  He reports his wife has been giving him honey and he has been using an incentive spirometer which she thinks helps significantly.  He denies any fevers or chest pain other than soreness from coughing.  The history is provided by the patient.  URI      Home Medications Prior to Admission medications   Medication Sig Start Date End Date Taking? Authorizing Provider  benzonatate (TESSALON) 100 MG capsule Take 1 capsule (100 mg total) by mouth every 8 (eight) hours. 06/14/23  Yes Eva Vallee, Alphonzo Lemmings, MD  amLODipine (NORVASC) 10 MG tablet Take 1 tablet by mouth daily. 05/27/21   [provider]  aspirin 81 MG chewable tablet Chew 81 mg by mouth daily. Patient not taking: Reported on 02/25/2022    [provider]  atorvastatin (LIPITOR) 80 MG tablet Take 80 mg by mouth daily.    [provider]  EPINEPHrine 0.3 mg/0.3 mL IJ SOAJ injection Inject 0.3 mg into the muscle as needed for anaphylaxis. Patient not taking: Reported on 02/25/2022 04/17/21   Pricilla Loveless, MD  gabapentin (NEURONTIN) 300 MG capsule Take by mouth. Patient not taking: Reported on 02/25/2022 08/12/20   [provider]  metoprolol succinate (TOPROL-XL) 25 MG 24 hr tablet Take 25 mg by mouth daily. 06/13/21   [provider]  nitroGLYCERIN (NITROSTAT) 0.4 MG SL tablet Place 0.4 mg under the tongue every 5 (five) minutes as  needed for chest pain.    [provider]  ondansetron (ZOFRAN-ODT) 4 MG disintegrating tablet Dissolve 1 tablet under the tongue every 8 (eight) hours as needed for nausea or vomiting. 11/07/22   Mannie Stabile, PA-C  rivaroxaban (XARELTO) 20 MG TABS tablet Take by mouth. 04/28/21   [provider]      Allergies    Lisinopril    Review of Systems   Review of Systems  Physical Exam Updated Vital Signs BP (!) 146/83   Pulse 62   Temp 98.6 F (37 C)   Resp 18   SpO2 97%  Physical Exam Vitals and nursing note reviewed.  Constitutional:      General: He is not in acute distress.    Appearance: He is well-developed.  HENT:     Head: Normocephalic and atraumatic.     Right Ear: Tympanic membrane normal.     Left Ear: Tympanic membrane normal.     Nose: Congestion present.     Mouth/Throat:     Mouth: Mucous membranes are moist.  Eyes:     Conjunctiva/sclera: Conjunctivae normal.     Pupils: Pupils are equal, round, and reactive to light.  Cardiovascular:     Rate and Rhythm: Normal rate and regular rhythm.     Heart sounds: No murmur heard. Pulmonary:     Effort: Pulmonary effort is normal. No respiratory distress.     Breath sounds: Wheezing present. No  rales.     Comments: Scant wheeze in the left lower lobe Abdominal:     General: There is no distension.     Palpations: Abdomen is soft.     Tenderness: There is no abdominal tenderness. There is no guarding or rebound.  Musculoskeletal:        General: No tenderness. Normal range of motion.     Cervical back: Normal range of motion and neck supple.     Right lower leg: No edema.     Left lower leg: No edema.  Skin:    General: Skin is warm and dry.     Findings: No erythema or rash.  Neurological:     Mental Status: He is alert and oriented to person, place, and time.  Psychiatric:        Behavior: Behavior normal.     ED Results / Procedures / Treatments   Labs (all labs ordered are  listed, but only abnormal results are displayed) Labs Reviewed  RESP PANEL BY RT-PCR (RSV, FLU A&B, COVID)  [id number]    EKG None  Radiology DG Chest 2 View Result Date: 06/14/2023 CLINICAL DATA:  URI cough EXAM: CHEST - 2 VIEW COMPARISON:  11/07/2022 FINDINGS: The heart size and mediastinal contours are within normal limits. Both lungs are clear. Dextroscoliosis. Right shoulder replacement. Incomplete inclusion of the right CP angle IMPRESSION: No active cardiopulmonary disease. Incomplete inclusion of right CP angle Electronically Signed   By: Jasmine Pang M.D.   On: 06/14/2023 19:30    Procedures Procedures    Medications Ordered in ED Medications - No data to display  ED Course/ Medical Decision Making/ A&P                                 Medical Decision Making Amount and/or Complexity of Data Reviewed Radiology: ordered.  Risk Prescription drug management.   Pt with symptoms consistent with viral URI.  Well appearing here.  No signs of breathing difficulty.  No signs of pharyngitis, otitis or abnormal abdominal findings.   I have independently visualized and interpreted pt's images today. CXR wnl and viral swab normal.  pt to return with any further problems.         Final Clinical Impression(s) / ED Diagnoses Final diagnoses:  Viral URI with cough    Rx / DC Orders ED Discharge Orders          Ordered    benzonatate (TESSALON) 100 MG capsule  Every 8 hours        06/14/23 2024              Gwyneth Sprout, MD 06/14/23 2029

## 2023-06-14 NOTE — ED Triage Notes (Signed)
 Pt is here for URI with cough and cold since Thursday.  Cough has been productive.

## 2023-06-14 NOTE — Discharge Instructions (Addendum)
 No signs of pneumonia today and your flu and COVID swabs were negative.  Continue using honey, the incentive spirometer.  You can try the Tessalon Perles to see if they help with the cough but you can also take Tylenol to help with the aches and pains.

## 2023-07-01 ENCOUNTER — Emergency Department (HOSPITAL_BASED_OUTPATIENT_CLINIC_OR_DEPARTMENT_OTHER)

## 2023-07-01 ENCOUNTER — Other Ambulatory Visit: Payer: Self-pay

## 2023-07-01 ENCOUNTER — Emergency Department (HOSPITAL_BASED_OUTPATIENT_CLINIC_OR_DEPARTMENT_OTHER)
Admission: EM | Admit: 2023-07-01 | Discharge: 2023-07-01 | Disposition: A | Discharge status: Home / Self Care | Attending: Emergency Medicine | Admitting: Emergency Medicine

## 2023-07-01 DIAGNOSIS — I10 Essential (primary) hypertension: Secondary | ICD-10-CM | POA: Diagnosis not present

## 2023-07-01 DIAGNOSIS — S0990XA Unspecified injury of head, initial encounter: Secondary | ICD-10-CM | POA: Diagnosis present

## 2023-07-01 DIAGNOSIS — Z7901 Long term (current) use of anticoagulants: Secondary | ICD-10-CM | POA: Insufficient documentation

## 2023-07-01 DIAGNOSIS — Y9389 Activity, other specified: Secondary | ICD-10-CM | POA: Insufficient documentation

## 2023-07-01 DIAGNOSIS — S0101XA Laceration without foreign body of scalp, initial encounter: Secondary | ICD-10-CM | POA: Insufficient documentation

## 2023-07-01 DIAGNOSIS — I2510 Atherosclerotic heart disease of native coronary artery without angina pectoris: Secondary | ICD-10-CM | POA: Diagnosis not present

## 2023-07-01 DIAGNOSIS — W228XXA Striking against or struck by other objects, initial encounter: Secondary | ICD-10-CM | POA: Insufficient documentation

## 2023-07-01 DIAGNOSIS — Z7982 Long term (current) use of aspirin: Secondary | ICD-10-CM | POA: Diagnosis not present

## 2023-07-01 DIAGNOSIS — Z79899 Other long term (current) drug therapy: Secondary | ICD-10-CM | POA: Diagnosis not present

## 2023-07-01 DIAGNOSIS — Z23 Encounter for immunization: Secondary | ICD-10-CM | POA: Insufficient documentation

## 2023-07-01 MED ORDER — TETANUS-DIPHTH-ACELL PERTUSSIS 5-2.5-18.5 LF-MCG/0.5 IM SUSY
0.5000 mL | PREFILLED_SYRINGE | Freq: Once | INTRAMUSCULAR | Status: AC
  Administered 2023-07-01: 0.5 mL via INTRAMUSCULAR
  Filled 2023-07-01: qty 0.5

## 2023-07-01 MED ORDER — LIDOCAINE HCL (PF) 1 % IJ SOLN
5.0000 mL | Freq: Once | INTRAMUSCULAR | Status: AC
  Administered 2023-07-01: 5 mL
  Filled 2023-07-01: qty 5

## 2023-07-01 NOTE — ED Notes (Addendum)
 Discharge instructions reviewed.   Opportunity for questions and concerns provided.   EDP placed 3 staples in head. Encouraged to follow up in 7-10 days for staple removal.   Alert, oriented and ambulatory. Displays no signs of distress.

## 2023-07-01 NOTE — ED Provider Notes (Signed)
 [place] EMERGENCY DEPARTMENT AT [facility] Provider Note   CSN: [id number] Arrival date & time: 07/01/23  1648     History  Chief Complaint  Patient presents with   Head Injury    Andres [name] is a 78 y.o. male.  Patient is a 78 year old male with a past medical history of hypertension, CAD and is on Xarelto presenting to the emergency department with head injury.  The patient states that he was playing at a playground with his grandson and was walking under one of the playset when he hit the top of his head and cut his head.  He denies falling to the ground or losing consciousness.  He denies any headache, vision changes, numbness or weakness or nausea or vomiting.  He states that his tetanus is up-to-date.  The history is provided by the patient.  Head Injury      Home Medications Prior to Admission medications   Medication Sig Start Date End Date Taking? Authorizing Provider  amLODipine (NORVASC) 10 MG tablet Take 1 tablet by mouth daily. 05/27/21   [provider]  aspirin 81 MG chewable tablet Chew 81 mg by mouth daily. Patient not taking: Reported on 02/25/2022    [provider]  atorvastatin (LIPITOR) 80 MG tablet Take 80 mg by mouth daily.    [provider]  benzonatate (TESSALON) 100 MG capsule Take 1 capsule (100 mg total) by mouth every 8 (eight) hours. 06/14/23   Gwyneth Sprout, MD  EPINEPHrine 0.3 mg/0.3 mL IJ SOAJ injection Inject 0.3 mg into the muscle as needed for anaphylaxis. Patient not taking: Reported on 02/25/2022 04/17/21   Pricilla Loveless, MD  gabapentin (NEURONTIN) 300 MG capsule Take by mouth. Patient not taking: Reported on 02/25/2022 08/12/20   [provider]  metoprolol succinate (TOPROL-XL) 25 MG 24 hr tablet Take 25 mg by mouth daily. 06/13/21   [provider]  nitroGLYCERIN (NITROSTAT) 0.4 MG SL tablet Place 0.4 mg under the tongue every 5 (five) minutes as needed for chest pain.     [provider]  ondansetron (ZOFRAN-ODT) 4 MG disintegrating tablet Dissolve 1 tablet under the tongue every 8 (eight) hours as needed for nausea or vomiting. 11/07/22   Mannie Stabile, PA-C  rivaroxaban (XARELTO) 20 MG TABS tablet Take by mouth. 04/28/21   [provider]      Allergies    Lisinopril    Review of Systems   Review of Systems  Physical Exam Updated Vital Signs BP 130/77 (BP Location: Right Arm)   Pulse 68   Temp 97.9 F (36.6 C) (Oral)   Resp 18   Ht 5\' 9"  (1.753 m)   Wt 87.1 kg   SpO2 95%   BMI 28.35 kg/m  Physical Exam Vitals and nursing note reviewed.  Constitutional:      General: He is not in acute distress.    Appearance: Normal appearance.  HENT:     Head: Normocephalic.     Comments: ~3-4 cm minimally gaping, non-bleeding laceration to R top of scalp    Nose: Nose normal.     Mouth/Throat:     Mouth: Mucous membranes are moist.     Pharynx: Oropharynx is clear.  Eyes:     Extraocular Movements: Extraocular movements intact.     Conjunctiva/sclera: Conjunctivae normal.     Pupils: Pupils are equal, round, and reactive to light.  Cardiovascular:     Rate and Rhythm: Normal rate.     Pulses:  Normal pulses.  Pulmonary:     Effort: Pulmonary effort is normal.  Abdominal:     General: Abdomen is flat.  Musculoskeletal:        General: Normal range of motion.     Cervical back: Normal range of motion and neck supple.  Skin:    General: Skin is warm and dry.  Neurological:     General: No focal deficit present.     Mental Status: He is alert and oriented to person, place, and time.     Cranial Nerves: No cranial nerve deficit.     Sensory: No sensory deficit.     Motor: No weakness.  Psychiatric:        Mood and Affect: Mood normal.        Behavior: Behavior normal.     ED Results / Procedures / Treatments   Labs (all labs ordered are listed, but only abnormal results are displayed) Labs Reviewed - No data to  display  EKG None  Radiology CT Head Wo Contrast Result Date: 07/01/2023 CLINICAL DATA:  Head trauma, moderate-severe EXAM: CT HEAD WITHOUT CONTRAST TECHNIQUE: Contiguous axial images were obtained from the base of the skull through the vertex without intravenous contrast. RADIATION DOSE REDUCTION: This exam was performed according to the departmental dose-optimization program which includes automated exposure control, adjustment of the mA and/or kV according to patient size and/or use of iterative reconstruction technique. COMPARISON:  MRI head 01/14/2023.  CT head 03/26/2022 FINDINGS: Brain: No evidence of acute infarction, hemorrhage, hydrocephalus, extra-axial collection or mass lesion/mass effect. Vascular: No hyperdense vessel. Skull: No acute fracture. Sinuses/Orbits: Clear sinuses.  No acute orbital findings. Other: No mastoid effusions. IMPRESSION: No evidence of acute intracranial abnormality. Electronically Signed   By: Feliberto Harts M.D.   On: 07/01/2023 20:28    Procedures .Laceration Repair  Date/Time: 07/01/2023 8:43 PM  Performed by: Rexford Maus, DO Authorized by: Rexford Maus, DO   Consent:    Consent obtained:  Verbal   Consent given by:  Patient   Risks discussed:  Infection, need for additional repair, pain, poor cosmetic result and poor wound healing   Alternatives discussed:  No treatment Universal protocol:    Procedure explained and questions answered to patient or proxy's satisfaction: yes     Patient identity confirmed:  Verbally with patient Laceration details:    Location:  Scalp   Scalp location:  R parietal   Length (cm):  3   Depth (mm):  2 Pre-procedure details:    Preparation:  Imaging obtained to evaluate for foreign bodies Exploration:    Limited defect created (wound extended): yes     Hemostasis achieved with:  Direct pressure   Imaging obtained comment:  CTH   Imaging outcome: foreign body not noted     Wound exploration:  wound explored through full range of motion and entire depth of wound visualized     Wound extent: areolar tissue not violated, fascia not violated, no foreign body, no signs of injury, no nerve damage, no tendon damage, no underlying fracture and no vascular damage     Contaminated: no   Treatment:    Area cleansed with:  Shur-Clens   Amount of cleaning:  Standard   Irrigation solution:  Sterile saline   Irrigation method:  Pressure wash   Visualized foreign bodies/material removed: no     Debridement:  None   Undermining:  None   Scar revision: no   Skin repair:  Repair method:  Staples   Number of staples:  3 Approximation:    Approximation:  Close Repair type:    Repair type:  Simple Post-procedure details:    Dressing:  Open (no dressing)   Procedure completion:  Tolerated well, no immediate complications     Medications Ordered in ED Medications  Tdap (BOOSTRIX) injection 0.5 mL (has no administration in time range)  lidocaine (PF) (XYLOCAINE) 1 % injection 5 mL (5 mLs Infiltration Given 07/01/23 1837)    ED Course/ Medical Decision Making/ A&P Clinical Course as of 07/01/23 2046  Thu Jul 01, 2023  2044 CTH negative, patient requested tetanus to be updated as he is now unclear when it was last updated. He is otherwise stable for discharge home with outpatient follow up and was given strict return precautions. [VK]    Clinical Course User Index [VK] Rexford Maus, DO                                 Medical Decision Making This patient presents to the ED with chief complaint(s) of head injury with pertinent past medical history of HTN, CAD, on Xarelto which further complicates the presenting complaint. The complaint involves an extensive differential diagnosis and also carries with it a high risk of complications and morbidity.    The differential diagnosis includes due to patient's head injury on Xarelto concern for ICH, mass effect, abrasion, laceration,  foreign body  Additional history obtained: Additional history obtained from N/A Records reviewed Care Everywhere/External Records  ED Course and Reassessment: On patient's arrival he was hemodynamically stable in no acute distress.  He was initially evaluated in triage and had head CT ordered.  Patient does have a laceration to the scalp that will require repair.  Tetanus is up-to-date and does not require an update today.  No other injury seen on exam.  Independent labs interpretation:  N/A  Independent visualization of imaging: - I independently visualized the following imaging with scope of interpretation limited to determining acute life threatening conditions related to emergency care: CTH, which revealed no acute disease  Consultation: - Consulted or discussed management/test interpretation w/ external professional: N/A  Consideration for admission or further workup: Patient has no emergent conditions requiring admission or further work-up at this time and is stable for discharge home with primary care follow-up  Social Determinants of health: N/A    Risk Prescription drug management.          Final Clinical Impression(s) / ED Diagnoses Final diagnoses:  Injury of head, initial encounter  Laceration of scalp, initial encounter    Rx / DC Orders ED Discharge Orders     None         Rexford Maus, DO 07/01/23 2046

## 2023-07-01 NOTE — ED Notes (Signed)
 Patient transported to CT

## 2023-07-01 NOTE — ED Provider Triage Note (Signed)
 Emergency Medicine Provider Triage Evaluation Note  Andres [name] , a 78 y.o. male  was evaluated in triage.  Pt complains of laceration to the top of his head.  Struck it against the structure at the park chasing after his grandson.  Patient on Xarelto.  He did not lose consciousness.  Review of Systems  Positive:  Negative: See above   Physical Exam  BP (!) 141/87 (BP Location: Right Arm)   Pulse (!) 101   Temp 98 F (36.7 C) (Oral)   Resp 18   Ht 5\' 9"  (1.753 m)   Wt 87.1 kg   SpO2 93%   BMI 28.35 kg/m  Gen:   Awake, no distress   Resp:  Normal effort  MSK:   Moves extremities without difficulty  Other:  Deep abrasion to the right parietal scalp  Medical Decision Making  Medically screening exam initiated at 5:40 PM.  Appropriate orders placed.  Eugenia Pancoast was informed that the remainder of the evaluation will be completed by another provider, this initial triage assessment does not replace that evaluation, and the importance of remaining in the ED until their evaluation is complete.     Honor Loh [place], [place] 07/01/23 [phone]

## 2023-07-01 NOTE — ED Triage Notes (Addendum)
 Xarelto for afib. Struck head on playground equipment-lac to top of head. No LOC. No neck pain.

## 2023-07-01 NOTE — Discharge Instructions (Signed)
 You were seen in the emergency department after your head injury.  Your head CT scan showed no broken bones or internal bleeding.  You did have a cut on your scalp that we repaired with 3 staples.  These will need to be removed in 7 to 10 days.  You can have them removed by your primary doctor, urgent care in the ER.  You can shower with the staples in, just do not soak your head underwater and make sure you completely dry her scalp after your shower.  You should return to the ER sooner if you notice pus draining from your wound, spreading redness, fevers or any other new or concerning symptoms.

## 2023-07-10 ENCOUNTER — Emergency Department (HOSPITAL_BASED_OUTPATIENT_CLINIC_OR_DEPARTMENT_OTHER)
Admission: EM | Admit: 2023-07-10 | Discharge: 2023-07-10 | Disposition: A | Discharge status: Home / Self Care | Attending: Emergency Medicine | Admitting: Emergency Medicine

## 2023-07-10 ENCOUNTER — Encounter (HOSPITAL_BASED_OUTPATIENT_CLINIC_OR_DEPARTMENT_OTHER): Payer: Self-pay

## 2023-07-10 ENCOUNTER — Other Ambulatory Visit: Payer: Self-pay

## 2023-07-10 DIAGNOSIS — Z4802 Encounter for removal of sutures: Secondary | ICD-10-CM | POA: Insufficient documentation

## 2023-07-10 DIAGNOSIS — Z7982 Long term (current) use of aspirin: Secondary | ICD-10-CM | POA: Insufficient documentation

## 2023-07-10 NOTE — ED Provider Notes (Signed)
 [place] EMERGENCY DEPARTMENT AT [facility] Provider Note   CSN: [id number] Arrival date & time: 07/10/23  1136     History  Chief Complaint  Patient presents with   Suture / Staple Removal   HPI Andres [name] is a 78 y.o. male presenting for staple removal from prior scalp laceration.  Per his chart, had a head injury on March 6 and was evaluated here.  3 staples were placed at that time.  States at home the wound has been healing nicely.  Denies any pustulant discharge.  Denies any tenderness now to the site.  States he has been cleaning it with soap and water every day.  Denies fever or chills.   Suture / Staple Removal       Home Medications Prior to Admission medications   Medication Sig Start Date End Date Taking? Authorizing Provider  amLODipine (NORVASC) 10 MG tablet Take 1 tablet by mouth daily. 05/27/21   [provider]  aspirin 81 MG chewable tablet Chew 81 mg by mouth daily. Patient not taking: Reported on 02/25/2022    [provider]  atorvastatin (LIPITOR) 80 MG tablet Take 80 mg by mouth daily.    [provider]  benzonatate (TESSALON) 100 MG capsule Take 1 capsule (100 mg total) by mouth every 8 (eight) hours. 06/14/23   Gwyneth Sprout, MD  EPINEPHrine 0.3 mg/0.3 mL IJ SOAJ injection Inject 0.3 mg into the muscle as needed for anaphylaxis. Patient not taking: Reported on 02/25/2022 04/17/21   Pricilla Loveless, MD  gabapentin (NEURONTIN) 300 MG capsule Take by mouth. Patient not taking: Reported on 02/25/2022 08/12/20   [provider]  metoprolol succinate (TOPROL-XL) 25 MG 24 hr tablet Take 25 mg by mouth daily. 06/13/21   [provider]  nitroGLYCERIN (NITROSTAT) 0.4 MG SL tablet Place 0.4 mg under the tongue every 5 (five) minutes as needed for chest pain.    [provider]  ondansetron (ZOFRAN-ODT) 4 MG disintegrating tablet Dissolve 1 tablet under the tongue every 8 (eight) hours as needed  for nausea or vomiting. 11/07/22   Mannie Stabile, PA-C  rivaroxaban (XARELTO) 20 MG TABS tablet Take by mouth. 04/28/21   [provider]      Allergies    Lisinopril    Review of Systems   See HPI  Physical Exam Updated Vital Signs BP 137/83 (BP Location: Right Arm)   Pulse 65   Temp 98 F (36.7 C)   Resp 17   Ht 5\' 9"  (1.753 m)   Wt 87.1 kg   SpO2 97%   BMI 28.35 kg/m  Physical Exam Constitutional:      Appearance: Normal appearance.  HENT:     Head: Normocephalic.     Comments: Right superior scalp wound noted it is well-approximated and well endothelialized.  3 residing staples present.  Not oozing or bleeding.    Nose: Nose normal.  Eyes:     Conjunctiva/sclera: Conjunctivae normal.  Pulmonary:     Effort: Pulmonary effort is normal.  Neurological:     Mental Status: He is alert.  Psychiatric:        Mood and Affect: Mood normal.     ED Results / Procedures / Treatments   Labs (all labs ordered are listed, but only abnormal results are displayed) Labs Reviewed - No data to display  EKG None  Radiology No results found.  Procedures Procedures    Medications Ordered in ED Medications - No data to  display  ED Course/ Medical Decision Making/ A&P                                 Medical Decision Making  78 year old well-appearing male presenting for staple removal.  Exam notable for a well-healing scalp laceration with 3 residing staples.  No signs of infection.  Patient also looks well, no acute distress and hemodynamically stable.  Nursing staff successfully remove the staples.  I advised him to follow-up with his PCP and continue to clean the lesion with soap and water at home.  Discussed pertinent return precautions.  Discharged good condition.        Final Clinical Impression(s) / ED Diagnoses Final diagnoses:  Encounter for staple removal    Rx / DC Orders ED Discharge Orders     None         Gareth Eagle,  PA-C 07/10/23 1211    Terald Sleeper, MD 07/10/23 [phone]

## 2023-07-10 NOTE — ED Notes (Signed)
 Wound well approximated. John PA examined the wound. 3 staples removed from scalp. No bleeding noted.

## 2023-07-10 NOTE — ED Triage Notes (Signed)
 In for staple removal to head. Placed on 07/01/2023.

## 2023-07-10 NOTE — Discharge Instructions (Signed)
 Evaluation today was overall reassuring.  It looks like the laceration on her scalp is healing very well.  Please continue to clean the wound at home with soap and water daily.  Please follow-up with your PCP.  If the wound becomes more red, tender to touch, oozes pustulant discharge or you develop a fever or any other concerning symptom please return emergency department further evaluation.

## 2023-09-09 ENCOUNTER — Other Ambulatory Visit (HOSPITAL_BASED_OUTPATIENT_CLINIC_OR_DEPARTMENT_OTHER): Payer: Self-pay

## 2023-09-09 DIAGNOSIS — M5416 Radiculopathy, lumbar region: Secondary | ICD-10-CM

## 2023-09-10 ENCOUNTER — Ambulatory Visit (HOSPITAL_BASED_OUTPATIENT_CLINIC_OR_DEPARTMENT_OTHER)
Admission: RE | Admit: 2023-09-10 | Discharge: 2023-09-10 | Disposition: A | Discharge status: Home / Self Care | Source: Ambulatory Visit

## 2023-09-10 DIAGNOSIS — M5416 Radiculopathy, lumbar region: Secondary | ICD-10-CM | POA: Insufficient documentation
